# Patient Record
Sex: Male | Born: 1990 | Race: Black or African American | Hispanic: No | Marital: Single | Smoking: Current every day smoker
Health system: Southern US, Community
[De-identification: ages and names within clinical notes are randomized; demographics above are authoritative.]

## PROBLEM LIST (undated history)

## (undated) DIAGNOSIS — W3400XA Accidental discharge from unspecified firearms or gun, initial encounter: Secondary | ICD-10-CM

## (undated) DIAGNOSIS — Y249XXA Unspecified firearm discharge, undetermined intent, initial encounter: Secondary | ICD-10-CM

## (undated) HISTORY — PX: LEG SURGERY: SHX1003

---

## 2020-03-06 ENCOUNTER — Emergency Department (HOSPITAL_COMMUNITY): Payer: No Typology Code available for payment source

## 2020-03-06 ENCOUNTER — Other Ambulatory Visit: Payer: Self-pay

## 2020-03-06 ENCOUNTER — Telehealth: Payer: Self-pay

## 2020-03-06 ENCOUNTER — Ambulatory Visit (HOSPITAL_COMMUNITY)
Admission: EM | Admit: 2020-03-06 | Discharge: 2020-03-06 | Disposition: A | Discharge status: Home / Self Care | Payer: No Typology Code available for payment source | Attending: Emergency Medicine | Admitting: Emergency Medicine

## 2020-03-06 ENCOUNTER — Encounter (HOSPITAL_COMMUNITY): Payer: Self-pay | Admitting: *Deleted

## 2020-03-06 ENCOUNTER — Emergency Department (HOSPITAL_COMMUNITY): Payer: No Typology Code available for payment source | Admitting: Certified Registered"

## 2020-03-06 ENCOUNTER — Encounter (HOSPITAL_COMMUNITY)
Admission: EM | Disposition: A | Discharge status: Home / Self Care | Payer: Self-pay | Source: Home / Self Care | Attending: Emergency Medicine

## 2020-03-06 DIAGNOSIS — S76129A Laceration of unspecified quadriceps muscle, fascia and tendon, initial encounter: Secondary | ICD-10-CM | POA: Insufficient documentation

## 2020-03-06 DIAGNOSIS — F17210 Nicotine dependence, cigarettes, uncomplicated: Secondary | ICD-10-CM | POA: Insufficient documentation

## 2020-03-06 DIAGNOSIS — S81012A Laceration without foreign body, left knee, initial encounter: Secondary | ICD-10-CM | POA: Insufficient documentation

## 2020-03-06 DIAGNOSIS — S022XXA Fracture of nasal bones, initial encounter for closed fracture: Secondary | ICD-10-CM

## 2020-03-06 DIAGNOSIS — Y92410 Unspecified street and highway as the place of occurrence of the external cause: Secondary | ICD-10-CM | POA: Diagnosis not present

## 2020-03-06 DIAGNOSIS — S139XXA Sprain of joints and ligaments of unspecified parts of neck, initial encounter: Secondary | ICD-10-CM | POA: Insufficient documentation

## 2020-03-06 DIAGNOSIS — S301XXA Contusion of abdominal wall, initial encounter: Secondary | ICD-10-CM

## 2020-03-06 DIAGNOSIS — S0101XA Laceration without foreign body of scalp, initial encounter: Secondary | ICD-10-CM | POA: Diagnosis not present

## 2020-03-06 DIAGNOSIS — S0990XA Unspecified injury of head, initial encounter: Secondary | ICD-10-CM

## 2020-03-06 DIAGNOSIS — S76122A Laceration of left quadriceps muscle, fascia and tendon, initial encounter: Secondary | ICD-10-CM

## 2020-03-06 DIAGNOSIS — Z20822 Contact with and (suspected) exposure to covid-19: Secondary | ICD-10-CM | POA: Insufficient documentation

## 2020-03-06 HISTORY — PX: IRRIGATION AND DEBRIDEMENT KNEE: SHX5185

## 2020-03-06 HISTORY — PX: SCALP LACERATION REPAIR: SHX6089

## 2020-03-06 LAB — CBC WITH DIFFERENTIAL/PLATELET
Abs Immature Granulocytes: 0.03 10*3/uL (ref 0.00–0.07)
Basophils Absolute: 0.1 10*3/uL (ref 0.0–0.1)
Basophils Relative: 1 %
Eosinophils Absolute: 0.1 10*3/uL (ref 0.0–0.5)
Eosinophils Relative: 1 %
HCT: 48.9 % (ref 39.0–52.0)
Hemoglobin: 16.8 g/dL (ref 13.0–17.0)
Immature Granulocytes: 0 %
Lymphocytes Relative: 28 %
Lymphs Abs: 2.3 10*3/uL (ref 0.7–4.0)
MCH: 34.4 pg — ABNORMAL HIGH (ref 26.0–34.0)
MCHC: 34.4 g/dL (ref 30.0–36.0)
MCV: 100 fL (ref 80.0–100.0)
Monocytes Absolute: 0.8 10*3/uL (ref 0.1–1.0)
Monocytes Relative: 11 %
Neutro Abs: 4.7 10*3/uL (ref 1.7–7.7)
Neutrophils Relative %: 59 %
Platelets: 187 10*3/uL (ref 150–400)
RBC: 4.89 MIL/uL (ref 4.22–5.81)
RDW: 11.2 % — ABNORMAL LOW (ref 11.5–15.5)
WBC: 8 10*3/uL (ref 4.0–10.5)
nRBC: 0 % (ref 0.0–0.2)

## 2020-03-06 LAB — COMPREHENSIVE METABOLIC PANEL
ALT: 23 U/L (ref 0–44)
AST: 38 U/L (ref 15–41)
Albumin: 5.3 g/dL — ABNORMAL HIGH (ref 3.5–5.0)
Alkaline Phosphatase: 53 U/L (ref 38–126)
Anion gap: 10 (ref 5–15)
BUN: 5 mg/dL — ABNORMAL LOW (ref 6–20)
CO2: 26 mmol/L (ref 22–32)
Calcium: 9.9 mg/dL (ref 8.9–10.3)
Chloride: 102 mmol/L (ref 98–111)
Creatinine, Ser: 0.99 mg/dL (ref 0.61–1.24)
GFR calc Af Amer: >60 mL/min (ref >60)
GFR calc non Af Amer: >60 mL/min (ref >60)
Glucose, Bld: 92 mg/dL (ref 70–99)
Potassium: 3.8 mmol/L (ref 3.5–5.1)
Sodium: 138 mmol/L (ref 135–145)
Total Bilirubin: 5 mg/dL — ABNORMAL HIGH (ref 0.3–1.2)
Total Protein: 7.6 g/dL (ref 6.5–8.1)

## 2020-03-06 LAB — URINALYSIS, ROUTINE W REFLEX MICROSCOPIC
Bilirubin Urine: NEGATIVE
Glucose, UA: NEGATIVE mg/dL
Hgb urine dipstick: NEGATIVE
Ketones, ur: NEGATIVE mg/dL
Leukocytes,Ua: NEGATIVE
Nitrite: NEGATIVE
Protein, ur: NEGATIVE mg/dL
Specific Gravity, Urine: 1.021 (ref 1.005–1.030)
pH: 7 (ref 5.0–8.0)

## 2020-03-06 LAB — SARS CORONAVIRUS 2 BY RT PCR (HOSPITAL ORDER, PERFORMED IN ~~LOC~~ HOSPITAL LAB): SARS Coronavirus 2: NEGATIVE

## 2020-03-06 LAB — ETHANOL: Alcohol, Ethyl (B): <10 mg/dL (ref <10)

## 2020-03-06 SURGERY — IRRIGATION AND DEBRIDEMENT KNEE
Anesthesia: General | Site: Knee | Laterality: Left

## 2020-03-06 MED ORDER — FENTANYL CITRATE (PF) 100 MCG/2ML IJ SOLN: 25.0000 ug | INTRAMUSCULAR | Status: DC | PRN

## 2020-03-06 MED ORDER — ROCURONIUM BROMIDE 10 MG/ML (PF) SYRINGE
PREFILLED_SYRINGE | INTRAVENOUS | Status: AC
  Filled 2020-03-06: qty 10

## 2020-03-06 MED ORDER — BUPIVACAINE HCL (PF) 0.5 % IJ SOLN
INTRAMUSCULAR | Status: DC | PRN
  Administered 2020-03-06: 10 mL

## 2020-03-06 MED ORDER — PROPOFOL 10 MG/ML IV BOLUS
INTRAVENOUS | Status: AC
  Filled 2020-03-06: qty 20

## 2020-03-06 MED ORDER — ONDANSETRON HCL 4 MG/2ML IJ SOLN
INTRAMUSCULAR | Status: AC
  Filled 2020-03-06: qty 2

## 2020-03-06 MED ORDER — SUCCINYLCHOLINE CHLORIDE 20 MG/ML IJ SOLN
INTRAMUSCULAR | Status: DC | PRN
  Administered 2020-03-06: 120 mg via INTRAVENOUS

## 2020-03-06 MED ORDER — LACTATED RINGERS IV SOLN: INTRAVENOUS | Status: DC

## 2020-03-06 MED ORDER — FENTANYL CITRATE (PF) 250 MCG/5ML IJ SOLN
INTRAMUSCULAR | Status: AC
  Filled 2020-03-06: qty 5

## 2020-03-06 MED ORDER — IOHEXOL 300 MG/ML  SOLN
100.0000 mL | Freq: Once | INTRAMUSCULAR | Status: AC | PRN
  Administered 2020-03-06: 100 mL via INTRAVENOUS

## 2020-03-06 MED ORDER — SODIUM CHLORIDE 0.9 % IR SOLN
Status: DC | PRN
  Administered 2020-03-06: 6000 mL

## 2020-03-06 MED ORDER — ONDANSETRON HCL 4 MG/2ML IJ SOLN: 4.0000 mg | Freq: Once | INTRAMUSCULAR | Status: DC | PRN

## 2020-03-06 MED ORDER — SUCCINYLCHOLINE CHLORIDE 200 MG/10ML IV SOSY
PREFILLED_SYRINGE | INTRAVENOUS | Status: AC
  Filled 2020-03-06: qty 10

## 2020-03-06 MED ORDER — LIDOCAINE 2% (20 MG/ML) 5 ML SYRINGE
INTRAMUSCULAR | Status: AC
  Filled 2020-03-06: qty 5

## 2020-03-06 MED ORDER — SODIUM CHLORIDE 0.9 % IV BOLUS
1000.0000 mL | Freq: Once | INTRAVENOUS | Status: AC
  Administered 2020-03-06: 1000 mL via INTRAVENOUS

## 2020-03-06 MED ORDER — DEXAMETHASONE SODIUM PHOSPHATE 10 MG/ML IJ SOLN
INTRAMUSCULAR | Status: AC
  Filled 2020-03-06: qty 1

## 2020-03-06 MED ORDER — DEXMEDETOMIDINE HCL 200 MCG/2ML IV SOLN
INTRAVENOUS | Status: DC | PRN
  Administered 2020-03-06: 20 ug via INTRAVENOUS

## 2020-03-06 MED ORDER — LACTATED RINGERS IV SOLN: INTRAVENOUS | Status: DC | PRN

## 2020-03-06 MED ORDER — VANCOMYCIN HCL 1000 MG IV SOLR
INTRAVENOUS | Status: AC
  Filled 2020-03-06: qty 1000

## 2020-03-06 MED ORDER — CHLORHEXIDINE GLUCONATE 0.12 % MT SOLN: 15.0000 mL | Freq: Once | OROMUCOSAL | Status: DC

## 2020-03-06 MED ORDER — HYDROCODONE-ACETAMINOPHEN 5-325 MG PO TABS: 1.0000 | ORAL_TABLET | Freq: Four times a day (QID) | ORAL | 0 refills | Status: DC | PRN

## 2020-03-06 MED ORDER — ASPIRIN EC 325 MG PO TBEC
325.0000 mg | DELAYED_RELEASE_TABLET | Freq: Every day | ORAL | 0 refills | Status: AC
Start: 2020-03-06 — End: 2020-04-05

## 2020-03-06 MED ORDER — 0.9 % SODIUM CHLORIDE (POUR BTL) OPTIME
TOPICAL | Status: DC | PRN
  Administered 2020-03-06: 1000 mL

## 2020-03-06 MED ORDER — OXYCODONE HCL 5 MG PO TABS
5.0000 mg | ORAL_TABLET | Freq: Once | ORAL | Status: AC | PRN
  Administered 2020-03-06: 5 mg via ORAL

## 2020-03-06 MED ORDER — OXYCODONE HCL 5 MG/5ML PO SOLN: 5.0000 mg | Freq: Once | ORAL | Status: AC | PRN

## 2020-03-06 MED ORDER — CEFAZOLIN SODIUM-DEXTROSE 1-4 GM/50ML-% IV SOLN
1.0000 g | Freq: Once | INTRAVENOUS | Status: AC
  Administered 2020-03-06: 1 g via INTRAVENOUS
  Filled 2020-03-06: qty 50

## 2020-03-06 MED ORDER — BUPIVACAINE HCL (PF) 0.5 % IJ SOLN
INTRAMUSCULAR | Status: AC
  Filled 2020-03-06: qty 30

## 2020-03-06 MED ORDER — PROPOFOL 10 MG/ML IV BOLUS
INTRAVENOUS | Status: DC | PRN
  Administered 2020-03-06: 30 mg via INTRAVENOUS
  Administered 2020-03-06: 170 mg via INTRAVENOUS

## 2020-03-06 MED ORDER — MIDAZOLAM HCL 2 MG/2ML IJ SOLN
INTRAMUSCULAR | Status: AC
  Filled 2020-03-06: qty 2

## 2020-03-06 MED ORDER — VANCOMYCIN HCL 1000 MG IV SOLR
INTRAVENOUS | Status: DC | PRN
  Administered 2020-03-06: 1000 mg via TOPICAL

## 2020-03-06 MED ORDER — FENTANYL CITRATE (PF) 100 MCG/2ML IJ SOLN
INTRAMUSCULAR | Status: DC | PRN
  Administered 2020-03-06: 50 ug via INTRAVENOUS
  Administered 2020-03-06: 100 ug via INTRAVENOUS

## 2020-03-06 MED ORDER — ONDANSETRON HCL 4 MG/2ML IJ SOLN
INTRAMUSCULAR | Status: DC | PRN
  Administered 2020-03-06: 4 mg via INTRAVENOUS

## 2020-03-06 MED ORDER — OXYCODONE HCL 5 MG PO TABS
ORAL_TABLET | ORAL | Status: AC
  Filled 2020-03-06: qty 1

## 2020-03-06 MED ORDER — DEXAMETHASONE SODIUM PHOSPHATE 4 MG/ML IJ SOLN
INTRAMUSCULAR | Status: DC | PRN
  Administered 2020-03-06: 8 mg via INTRAVENOUS

## 2020-03-06 MED ORDER — LIDOCAINE HCL (CARDIAC) PF 100 MG/5ML IV SOSY
PREFILLED_SYRINGE | INTRAVENOUS | Status: DC | PRN
  Administered 2020-03-06: 40 mg via INTRAVENOUS

## 2020-03-06 MED ORDER — ORAL CARE MOUTH RINSE: 15.0000 mL | Freq: Once | OROMUCOSAL | Status: DC

## 2020-03-06 SURGICAL SUPPLY — 52 items
BNDG COHESIVE 4X5 TAN STRL (GAUZE/BANDAGES/DRESSINGS) ×3 IMPLANT
BNDG GAUZE ELAST 4 BULKY (GAUZE/BANDAGES/DRESSINGS) ×6 IMPLANT
BRUSH SCRUB EZ PLAIN DRY (MISCELLANEOUS) ×6 IMPLANT
CHLORAPREP W/TINT 26 (MISCELLANEOUS) ×3 IMPLANT
COVER MAYO STAND STRL (DRAPES) ×3 IMPLANT
COVER SURGICAL LIGHT HANDLE (MISCELLANEOUS) ×6 IMPLANT
COVER WAND RF STERILE (DRAPES) ×3 IMPLANT
DRAPE ORTHO SPLIT 77X108 STRL (DRAPES) ×1
DRAPE SURG 17X23 STRL (DRAPES) ×3 IMPLANT
DRAPE SURG ORHT 6 SPLT 77X108 (DRAPES) ×2 IMPLANT
DRAPE U-SHAPE 47X51 STRL (DRAPES) ×3 IMPLANT
DRESSING PREVENA PLUS CUSTOM (GAUZE/BANDAGES/DRESSINGS) ×2 IMPLANT
DRSG ADAPTIC 3X8 NADH LF (GAUZE/BANDAGES/DRESSINGS) ×3 IMPLANT
DRSG PREVENA PLUS CUSTOM (GAUZE/BANDAGES/DRESSINGS) ×3
ELECT REM PT RETURN 9FT ADLT (ELECTROSURGICAL)
ELECTRODE REM PT RTRN 9FT ADLT (ELECTROSURGICAL) IMPLANT
EVACUATOR 1/8 PVC DRAIN (DRAIN) IMPLANT
GAUZE SPONGE 4X4 12PLY STRL (GAUZE/BANDAGES/DRESSINGS) ×3 IMPLANT
GLOVE BIO SURGEON STRL SZ 6.5 (GLOVE) ×9 IMPLANT
GLOVE BIO SURGEON STRL SZ7.5 (GLOVE) ×12 IMPLANT
GLOVE BIOGEL PI IND STRL 6.5 (GLOVE) ×2 IMPLANT
GLOVE BIOGEL PI IND STRL 7.5 (GLOVE) ×2 IMPLANT
GLOVE BIOGEL PI INDICATOR 6.5 (GLOVE) ×1
GLOVE BIOGEL PI INDICATOR 7.5 (GLOVE) ×1
GOWN STRL REUS W/ TWL LRG LVL3 (GOWN DISPOSABLE) ×4 IMPLANT
GOWN STRL REUS W/TWL LRG LVL3 (GOWN DISPOSABLE) ×2
HANDPIECE INTERPULSE COAX TIP (DISPOSABLE) ×1
IMMOBILIZER KNEE 20 (SOFTGOODS) ×3
IMMOBILIZER KNEE 20 THIGH 36 (SOFTGOODS) ×2 IMPLANT
KIT BASIN OR (CUSTOM PROCEDURE TRAY) ×3 IMPLANT
KIT PREVENA INCISION MGT20CM45 (CANNISTER) ×3 IMPLANT
KIT TURNOVER KIT B (KITS) ×3 IMPLANT
MANIFOLD NEPTUNE II (INSTRUMENTS) ×3 IMPLANT
NS IRRIG 1000ML POUR BTL (IV SOLUTION) ×3 IMPLANT
PACK ORTHO EXTREMITY (CUSTOM PROCEDURE TRAY) ×3 IMPLANT
PAD ARMBOARD 7.5X6 YLW CONV (MISCELLANEOUS) ×6 IMPLANT
PADDING CAST COTTON 6X4 STRL (CAST SUPPLIES) ×3 IMPLANT
SET HNDPC FAN SPRY TIP SCT (DISPOSABLE) ×2 IMPLANT
SPONGE LAP 18X18 RF (DISPOSABLE) ×3 IMPLANT
STAPLER VISISTAT 35W (STAPLE) ×3 IMPLANT
SUT ETHILON 2 0 FS 18 (SUTURE) ×6 IMPLANT
SUT ETHILON 3 0 PS 1 (SUTURE) ×15 IMPLANT
SUT MON AB 2-0 CT1 36 (SUTURE) ×6 IMPLANT
SUT PDS AB 0 CT 36 (SUTURE) ×3 IMPLANT
SWAB CULTURE ESWAB REG 1ML (MISCELLANEOUS) IMPLANT
TIP HIGH FLOW IRRIGATION COAX (MISCELLANEOUS) ×3 IMPLANT
TOWEL GREEN STERILE (TOWEL DISPOSABLE) ×6 IMPLANT
TOWEL GREEN STERILE FF (TOWEL DISPOSABLE) ×3 IMPLANT
TUBE CONNECTING 12X1/4 (SUCTIONS) ×3 IMPLANT
UNDERPAD 30X36 HEAVY ABSORB (UNDERPADS AND DIAPERS) ×3 IMPLANT
WATER STERILE IRR 1000ML POUR (IV SOLUTION) ×3 IMPLANT
YANKAUER SUCT BULB TIP NO VENT (SUCTIONS) ×3 IMPLANT

## 2020-03-06 NOTE — Discharge Instructions (Addendum)
[facility]uma [facility] Discharge Instructions   General Discharge Instructions  WEIGHT BEARING STATUS: Weightbearing as tolerated left lower extremity in knee immobilizer  RANGE OF MOTION/ACTIVITY: Keep immobilizer on at all times.   Wound Care: Okay to remove surgical dressing on post op day #2 (Tuesday 03/08/20). Leave wound vac (purple sponge) in place until follow-up.   DVT/PE prophylaxis: Aspirin 325 mg daily x 30 days  Diet: as you were eating previously.  Can use over the counter stool softeners and bowel preparations, such as Miralax, to help with bowel movements.  Narcotics can be constipating.  Be sure to drink plenty of fluids  PAIN MEDICATION USE AND EXPECTATIONS  You have likely been given narcotic medications to help control your pain.  After a traumatic event that results in an fracture (broken bone) with or without surgery, it is ok to use narcotic pain medications to help control one's pain.  We understand that everyone responds to pain differently and each individual patient will be evaluated on a regular basis for the continued need for narcotic medications. Ideally, narcotic medication use should last no more than 6-8 weeks (coinciding with fracture healing).   As a patient it is your responsibility as well to monitor narcotic medication use and report the amount and frequency you use these medications when you come to your office visit.   We would also advise that if you are using narcotic medications, you should take a dose prior to therapy to maximize you participation.  IF YOU ARE ON NARCOTIC MEDICATIONS IT IS NOT PERMISSIBLE TO OPERATE A MOTOR VEHICLE (MOTORCYCLE/CAR/TRUCK/MOPED) OR HEAVY MACHINERY DO NOT MIX NARCOTICS WITH OTHER CNS (CENTRAL NERVOUS SYSTEM) DEPRESSANTS SUCH AS ALCOHOL   STOP SMOKING OR USING NICOTINE PRODUCTS!!!!  As discussed nicotine severely impairs your body's ability to heal surgical and traumatic wounds but also impairs bone healing.   Wounds and bone heal by forming microscopic blood vessels (angiogenesis) and nicotine is a vasoconstrictor (essentially, shrinks blood vessels).  Therefore, if vasoconstriction occurs to these microscopic blood vessels they essentially disappear and are unable to deliver necessary nutrients to the healing tissue.  This is one modifiable factor that you can do to dramatically increase your chances of healing your injury.    (This means no smoking, no nicotine gum, patches, etc)  DO NOT USE NONSTEROIDAL ANTI-INFLAMMATORY DRUGS (NSAID'S)  Using products such as Advil (ibuprofen), Aleve (naproxen), Motrin (ibuprofen) for additional pain control during fracture healing can delay and/or prevent the healing response.  If you would like to take over the counter (OTC) medication, Tylenol (acetaminophen) is ok.  However, some narcotic medications that are given for pain control contain acetaminophen as well. Therefore, you should not exceed more than 4000 mg of tylenol in a day if you do not have liver disease.  Also note that there are may OTC medicines, such as cold medicines and allergy medicines that my contain tylenol as well.  If you have any questions about medications and/or interactions please ask your doctor/PA or your pharmacist.      ICE AND ELEVATE INJURED/OPERATIVE EXTREMITY  Using ice and elevating the injured extremity above your heart can help with swelling and pain control.  Icing in a pulsatile fashion, such as 20 minutes on and 20 minutes off, can be followed.    Do not place ice directly on skin. Make sure there is a barrier between to skin and the ice pack.    Using frozen items such as frozen peas works well  as the conform nicely to the are that needs to be iced.  USE AN ACE WRAP OR TED HOSE FOR SWELLING CONTROL  In addition to icing and elevation, Ace wraps or TED hose are used to help limit and resolve swelling.  It is recommended to use Ace wraps or TED hose until you are informed to  stop.    When using Ace Wraps start the wrapping distally (farthest away from the body) and wrap proximally (closer to the body)   Example: If you had surgery on your leg or thing and you do not have a splint on, start the ace wrap at the toes and work your way up to the thigh        If you had surgery on your upper extremity and do not have a splint on, start the ace wrap at your fingers and work your way up to the upper arm  IF YOU ARE IN A SPLINT OR CAST DO NOT REMOVE IT FOR ANY REASON   If your splint gets wet for any reason please contact the office immediately. You may shower in your splint or cast as long as you keep it dry.  This can be done by wrapping in a cast cover or garbage back (or similar)  Do Not stick any thing down your splint or cast such as pencils, money, or hangers to try and scratch yourself with.  If you feel itchy take benadryl as prescribed on the bottle for itching  IF YOU ARE IN A CAM BOOT (BLACK BOOT)  You may remove boot periodically. Perform daily dressing changes as noted below.  Wash the liner of the boot regularly and wear a sock when wearing the boot. It is recommended that you sleep in the boot until told otherwise   CALL THE OFFICE WITH ANY QUESTIONS OR CONCERNS: [phone]   VISIT OUR WEBSITE FOR ADDITIONAL INFORMATION: [email or web address]      Discharge Wound Care Instructions  Do NOT apply any ointments, solutions or lotions to pin sites or surgical wounds.  These prevent needed drainage and even though solutions like hydrogen peroxide kill bacteria, they also damage cells lining the pin sites that help fight infection.  Applying lotions or ointments can keep the wounds moist and can cause them to breakdown and open up as well. This can increase the risk for infection. When in doubt call the office.  Surgical incisions should be dressed daily.  If any drainage is noted, use one layer of adaptic, then gauze, Kerlix, and an ace wrap.  Once the  incision is completely dry and without drainage, it may be left open to air out.  Showering may begin 36-48 hours later.  Cleaning gently with soap and water.  Traumatic wounds should be dressed daily as well.    One layer of adaptic, gauze, Kerlix, then ace wrap.  The adaptic can be discontinued once the draining has ceased    If you have a wet to dry dressing: wet the gauze with saline the squeeze as much saline out so the gauze is moist (not soaking wet), place moistened gauze over wound, then place a dry gauze over the moist one, followed by Kerlix wrap, then ace wrap.

## 2020-03-06 NOTE — Anesthesia Preprocedure Evaluation (Signed)
Anesthesia Evaluation  Patient identified by MRN, date of birth, ID band Patient awake    Reviewed: Allergy & Precautions, NPO status , Patient's Chart, lab work & pertinent test results  Airway Mallampati: II  TM Distance: >3 FB Neck ROM: Full    Dental  (+) Teeth Intact, Dental Advisory Given   Pulmonary Current Smoker,    breath sounds clear to auscultation       Cardiovascular  Rhythm:Regular Rate:Normal     Neuro/Psych    GI/Hepatic   Endo/Other    Renal/GU      Musculoskeletal   Abdominal   Peds  Hematology   Anesthesia Other Findings   Reproductive/Obstetrics                             Anesthesia Physical Anesthesia Plan  ASA: II and emergent  Anesthesia Plan: General   Post-op Pain Management:    Induction: Intravenous, Rapid sequence and Cricoid pressure planned  PONV Risk Score and Plan: Ondansetron and Dexamethasone  Airway Management Planned: Oral ETT  Additional Equipment:   Intra-op Plan:   Post-operative Plan: Extubation in OR  Informed Consent: I have reviewed the patients History and Physical, chart, labs and discussed the procedure including the risks, benefits and alternatives for the proposed anesthesia with the patient or authorized representative who has indicated his/her understanding and acceptance.     Dental advisory given  Plan Discussed with: CRNA and Anesthesiologist  Anesthesia Plan Comments:         Anesthesia Quick Evaluation  
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
## 2020-03-06 NOTE — ED Notes (Signed)
Called patient mother Kriste Basque) [phone] patient spoke with her

## 2020-03-06 NOTE — Telephone Encounter (Signed)
Patients asked CM to call mother and tell her that he is here nad that he has to go to the OR. Called no answer left message for her to call my phone in reference to her son. Also left text message.
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
## 2020-03-06 NOTE — Anesthesia Procedure Notes (Signed)
Procedure Name: Intubation Date/Time: 03/06/2020 1:02 PM Performed by: Julian Reil, CRNA Pre-anesthesia Checklist: Patient identified, Emergency Drugs available, Suction available and Patient being monitored Patient Re-evaluated:Patient Re-evaluated prior to induction Oxygen Delivery Method: Circle system utilized Preoxygenation: Pre-oxygenation with 100% oxygen Induction Type: IV induction, Cricoid Pressure applied and Rapid sequence Laryngoscope Size: Miller and 3 Grade View: Grade I Tube type: Oral Tube size: 8.0 mm Number of attempts: 1 Airway Equipment and Method: Stylet Placement Confirmation: ETT inserted through vocal cords under direct vision,  positive ETCO2 and breath sounds checked- equal and bilateral Secured at: 23 cm Tube secured with: Tape Dental Injury: Teeth and Oropharynx as per pre-operative assessment  Comments: 4x4s bite block used.

## 2020-03-06 NOTE — Transfer of Care (Signed)
Immediate Anesthesia Transfer of Care Note  Patient: Nathan Ball  Procedure(s) Performed: IRRIGATION AND DEBRIDEMENT KNEE (Left Knee) Scalp Laceration  repair left upper (Left )  Patient Location: PACU  Anesthesia Type:General  Level of Consciousness: drowsy  Airway & Oxygen Therapy: Patient Spontanous Breathing and Patient connected to nasal cannula oxygen  Post-op Assessment: Report given to RN and Post -op Vital signs reviewed and stable  Post vital signs: Reviewed and stable  Last Vitals:  Vitals Value Taken Time  BP 123/55 03/06/20 1413  Temp    Pulse 63 03/06/20 1418  Resp 14 03/06/20 1418  SpO2 100 % 03/06/20 1418  Vitals shown include unvalidated device data.  Last Pain:  Vitals:   03/06/20 1218  TempSrc: Oral  PainSc: 9          Complications: No apparent anesthesia complications
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
## 2020-03-06 NOTE — Anesthesia Postprocedure Evaluation (Signed)
Anesthesia Post Note  Patient: Nathan Ball  Procedure(s) Performed: IRRIGATION AND DEBRIDEMENT KNEE (Left Knee) Scalp Laceration  repair left upper (Left )     Patient location during evaluation: PACU Anesthesia Type: General Level of consciousness: awake, oriented and lethargic Pain management: pain level controlled Vital Signs Assessment: post-procedure vital signs reviewed and stable Respiratory status: spontaneous breathing, nonlabored ventilation, respiratory function stable and patient connected to nasal cannula oxygen Cardiovascular status: blood pressure returned to baseline and stable Postop Assessment: no apparent nausea or vomiting Anesthetic complications: no    Last Vitals:  Vitals:   03/06/20 1450 03/06/20 1505  BP: (!) 147/77 (!) 138/91  Pulse: (!) 45 (!) 57  Resp: 14 16  Temp:    SpO2: 100% 100%    Last Pain:  Vitals:   03/06/20 1450  TempSrc:   PainSc: Asleep                 Florabel Faulks COKER
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
## 2021-08-06 ENCOUNTER — Inpatient Hospital Stay (HOSPITAL_COMMUNITY)
Admission: EM | Admit: 2021-08-06 | Discharge: 2021-08-09 | DRG: 492 | Disposition: A | Discharge status: Home / Self Care | Payer: Self-pay | Attending: Student | Admitting: Student

## 2021-08-06 ENCOUNTER — Other Ambulatory Visit: Payer: Self-pay

## 2021-08-06 ENCOUNTER — Emergency Department (HOSPITAL_COMMUNITY): Payer: Self-pay

## 2021-08-06 DIAGNOSIS — Z419 Encounter for procedure for purposes other than remedying health state, unspecified: Secondary | ICD-10-CM

## 2021-08-06 DIAGNOSIS — T148XXA Other injury of unspecified body region, initial encounter: Secondary | ICD-10-CM

## 2021-08-06 DIAGNOSIS — W3400XA Accidental discharge from unspecified firearms or gun, initial encounter: Principal | ICD-10-CM

## 2021-08-06 DIAGNOSIS — S91109A Unspecified open wound of unspecified toe(s) without damage to nail, initial encounter: Secondary | ICD-10-CM

## 2021-08-06 DIAGNOSIS — T1490XA Injury, unspecified, initial encounter: Secondary | ICD-10-CM

## 2021-08-06 DIAGNOSIS — S62521B Displaced fracture of distal phalanx of right thumb, initial encounter for open fracture: Secondary | ICD-10-CM

## 2021-08-06 DIAGNOSIS — S72351A Displaced comminuted fracture of shaft of right femur, initial encounter for closed fracture: Secondary | ICD-10-CM | POA: Diagnosis present

## 2021-08-06 DIAGNOSIS — S82251B Displaced comminuted fracture of shaft of right tibia, initial encounter for open fracture type I or II: Secondary | ICD-10-CM

## 2021-08-06 DIAGNOSIS — D62 Acute posthemorrhagic anemia: Secondary | ICD-10-CM | POA: Diagnosis not present

## 2021-08-06 DIAGNOSIS — S62511A Displaced fracture of proximal phalanx of right thumb, initial encounter for closed fracture: Secondary | ICD-10-CM

## 2021-08-06 DIAGNOSIS — S62511B Displaced fracture of proximal phalanx of right thumb, initial encounter for open fracture: Secondary | ICD-10-CM | POA: Diagnosis present

## 2021-08-06 DIAGNOSIS — Z20822 Contact with and (suspected) exposure to covid-19: Secondary | ICD-10-CM | POA: Diagnosis present

## 2021-08-06 DIAGNOSIS — E872 Acidosis, unspecified: Secondary | ICD-10-CM | POA: Diagnosis present

## 2021-08-06 DIAGNOSIS — F17210 Nicotine dependence, cigarettes, uncomplicated: Secondary | ICD-10-CM | POA: Diagnosis present

## 2021-08-06 DIAGNOSIS — S62201A Unspecified fracture of first metacarpal bone, right hand, initial encounter for closed fracture: Secondary | ICD-10-CM | POA: Diagnosis present

## 2021-08-06 LAB — I-STAT CHEM 8, ED
BUN: 5 mg/dL — ABNORMAL LOW (ref 6–20)
Calcium, Ion: 1.01 mmol/L — ABNORMAL LOW (ref 1.15–1.40)
Chloride: 109 mmol/L (ref 98–111)
Creatinine, Ser: 1 mg/dL (ref 0.61–1.24)
Glucose, Bld: 154 mg/dL — ABNORMAL HIGH (ref 70–99)
HCT: 51 % (ref 39.0–52.0)
Hemoglobin: 17.3 g/dL — ABNORMAL HIGH (ref 13.0–17.0)
Potassium: 3 mmol/L — ABNORMAL LOW (ref 3.5–5.1)
Sodium: 139 mmol/L (ref 135–145)
TCO2: 11 mmol/L — ABNORMAL LOW (ref 22–32)

## 2021-08-06 LAB — URINALYSIS, ROUTINE W REFLEX MICROSCOPIC
Bilirubin Urine: NEGATIVE
Glucose, UA: NEGATIVE mg/dL
Hgb urine dipstick: NEGATIVE
Ketones, ur: 5 mg/dL — AB
Leukocytes,Ua: NEGATIVE
Nitrite: NEGATIVE
Protein, ur: 30 mg/dL — AB
Specific Gravity, Urine: 1.018 (ref 1.005–1.030)
pH: 5 (ref 5.0–8.0)

## 2021-08-06 LAB — COMPREHENSIVE METABOLIC PANEL
ALT: 24 U/L (ref 0–44)
AST: 35 U/L (ref 15–41)
Albumin: 4.3 g/dL (ref 3.5–5.0)
Alkaline Phosphatase: 42 U/L (ref 38–126)
Anion gap: 25 — ABNORMAL HIGH (ref 5–15)
BUN: 7 mg/dL (ref 6–20)
CO2: 9 mmol/L — ABNORMAL LOW (ref 22–32)
Calcium: 10.2 mg/dL (ref 8.9–10.3)
Chloride: 104 mmol/L (ref 98–111)
Creatinine, Ser: 1.38 mg/dL — ABNORMAL HIGH (ref 0.61–1.24)
GFR, Estimated: >60 mL/min (ref >60)
Glucose, Bld: 167 mg/dL — ABNORMAL HIGH (ref 70–99)
Potassium: 3.4 mmol/L — ABNORMAL LOW (ref 3.5–5.1)
Sodium: 138 mmol/L (ref 135–145)
Total Bilirubin: 3.6 mg/dL — ABNORMAL HIGH (ref 0.3–1.2)
Total Protein: 6.6 g/dL (ref 6.5–8.1)

## 2021-08-06 LAB — PROTIME-INR
INR: 1.1 (ref 0.8–1.2)
Prothrombin Time: 14.6 seconds (ref 11.4–15.2)

## 2021-08-06 LAB — CBC
HCT: 55 % — ABNORMAL HIGH (ref 39.0–52.0)
Hemoglobin: 17.9 g/dL — ABNORMAL HIGH (ref 13.0–17.0)
MCH: 34.4 pg — ABNORMAL HIGH (ref 26.0–34.0)
MCHC: 32.5 g/dL (ref 30.0–36.0)
MCV: 105.8 fL — ABNORMAL HIGH (ref 80.0–100.0)
Platelets: 376 10*3/uL (ref 150–400)
RBC: 5.2 MIL/uL (ref 4.22–5.81)
RDW: 11 % — ABNORMAL LOW (ref 11.5–15.5)
WBC: 16 10*3/uL — ABNORMAL HIGH (ref 4.0–10.5)
nRBC: 0 % (ref 0.0–0.2)

## 2021-08-06 LAB — RESP PANEL BY RT-PCR (FLU A&B, COVID) ARPGX2
Influenza A by PCR: NEGATIVE
Influenza B by PCR: NEGATIVE
SARS Coronavirus 2 by RT PCR: NEGATIVE

## 2021-08-06 LAB — LACTIC ACID, PLASMA
Lactic Acid, Venous: >9 mmol/L (ref 0.5–1.9)
Lactic Acid, Venous: 2.1 mmol/L (ref 0.5–1.9)

## 2021-08-06 LAB — ETHANOL: Alcohol, Ethyl (B): 18 mg/dL — ABNORMAL HIGH (ref <10)

## 2021-08-06 MED ORDER — ACETAMINOPHEN 500 MG PO TABS
1000.0000 mg | ORAL_TABLET | Freq: Four times a day (QID) | ORAL | Status: DC
  Administered 2021-08-06 – 2021-08-09 (×11): 1000 mg via ORAL
  Filled 2021-08-06 (×11): qty 2

## 2021-08-06 MED ORDER — TRAMADOL HCL 50 MG PO TABS
50.0000 mg | ORAL_TABLET | Freq: Four times a day (QID) | ORAL | Status: DC
Start: 2021-08-06 — End: 2021-08-08
  Administered 2021-08-06 – 2021-08-08 (×6): 50 mg via ORAL
  Filled 2021-08-06 (×6): qty 1

## 2021-08-06 MED ORDER — FENTANYL CITRATE PF 50 MCG/ML IJ SOSY
PREFILLED_SYRINGE | INTRAMUSCULAR | Status: AC
  Filled 2021-08-06: qty 1

## 2021-08-06 MED ORDER — FENTANYL CITRATE PF 50 MCG/ML IJ SOSY
50.0000 ug | PREFILLED_SYRINGE | INTRAMUSCULAR | Status: DC | PRN
Start: 2021-08-06 — End: 2021-08-07
  Administered 2021-08-06 – 2021-08-07 (×3): 50 ug via INTRAVENOUS
  Filled 2021-08-06 (×3): qty 1

## 2021-08-06 MED ORDER — FENTANYL CITRATE PF 50 MCG/ML IJ SOSY
100.0000 ug | PREFILLED_SYRINGE | Freq: Once | INTRAMUSCULAR | Status: DC
Start: 2021-08-06 — End: 2021-08-07

## 2021-08-06 MED ORDER — HYDROMORPHONE HCL 1 MG/ML IJ SOLN
1.0000 mg | Freq: Once | INTRAMUSCULAR | Status: AC
  Administered 2021-08-06: 1 mg via INTRAVENOUS
  Filled 2021-08-06: qty 1

## 2021-08-06 MED ORDER — OXYCODONE HCL 5 MG PO TABS
5.0000 mg | ORAL_TABLET | Freq: Four times a day (QID) | ORAL | Status: DC | PRN
  Administered 2021-08-06: 5 mg via ORAL
  Filled 2021-08-06: qty 1

## 2021-08-06 MED ORDER — FENTANYL CITRATE (PF) 100 MCG/2ML IJ SOLN
INTRAMUSCULAR | Status: AC
  Filled 2021-08-06: qty 2

## 2021-08-06 MED ORDER — MORPHINE SULFATE (PF) 4 MG/ML IV SOLN
INTRAVENOUS | Status: AC
  Administered 2021-08-06: 4 mg via INTRAVENOUS
  Filled 2021-08-06: qty 1

## 2021-08-06 MED ORDER — SODIUM CHLORIDE 0.9 % IV SOLN
2.0000 g | INTRAVENOUS | Status: DC
  Administered 2021-08-06: 2 g via INTRAVENOUS
  Filled 2021-08-06: qty 20

## 2021-08-06 MED ORDER — LACTATED RINGERS IV BOLUS
2000.0000 mL | Freq: Once | INTRAVENOUS | Status: AC
  Administered 2021-08-06: 2000 mL via INTRAVENOUS

## 2021-08-06 MED ORDER — MORPHINE SULFATE (PF) 4 MG/ML IV SOLN: 4.0000 mg | Freq: Once | INTRAVENOUS | Status: AC

## 2021-08-06 MED ORDER — FENTANYL CITRATE PF 50 MCG/ML IJ SOSY
50.0000 ug | PREFILLED_SYRINGE | Freq: Once | INTRAMUSCULAR | Status: AC
  Administered 2021-08-06: 50 ug via INTRAVENOUS
  Filled 2021-08-06: qty 1

## 2021-08-06 MED ORDER — FENTANYL CITRATE PF 50 MCG/ML IJ SOSY
50.0000 ug | PREFILLED_SYRINGE | Freq: Once | INTRAMUSCULAR | Status: AC
  Administered 2021-08-06: 50 ug via INTRAVENOUS
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
## 2021-08-06 NOTE — ED Notes (Addendum)
Critical Lactic > 9 called by lab. Primary RN and Bernette Mayers MD notified

## 2021-08-06 NOTE — H&P (Signed)
I have examined the patient at 1645 today. Compartments are compressible no pain with active or passive motion at the toes nervously intact with palpable pulses he does have some decreased sensation in his plantar foot. Right now his leg is not consistent with compartment syndrome
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
## 2021-08-06 NOTE — ED Notes (Signed)
Patient received ANCEF via [facility].
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
## 2021-08-06 NOTE — ED Notes (Signed)
Pt provided clean linens, support under injured leg rearranged for increased comfort. Pt given PRN pain medication, lights dimmed, mother at bedside. No additional requests at this time.
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
## 2021-08-06 NOTE — Progress Notes (Signed)
Orthopedic Tech Progress Note Patient Details:  Aws Shere [birth year] [id number]  Ortho Devices Type of Ortho Device: Long leg splint Ortho Device/Splint Location: rle Ortho Device/Splint Interventions: Ordered, Application, Adjustment   Post Interventions Patient Tolerated: Well Instructions Provided: Care of device, Poper ambulation with device  Gal Smolinski L Kimiye Strathman 08/06/2021, 7:23 PM

## 2021-08-06 NOTE — ED Notes (Signed)
Tourniquet removed
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
## 2021-08-07 ENCOUNTER — Encounter (HOSPITAL_COMMUNITY): Payer: Self-pay | Admitting: Orthopedic Surgery

## 2021-08-07 ENCOUNTER — Inpatient Hospital Stay (HOSPITAL_COMMUNITY): Payer: Self-pay | Admitting: Anesthesiology

## 2021-08-07 ENCOUNTER — Inpatient Hospital Stay (HOSPITAL_COMMUNITY): Payer: Self-pay

## 2021-08-07 ENCOUNTER — Encounter (HOSPITAL_COMMUNITY)
Admission: EM | Disposition: A | Discharge status: Home / Self Care | Payer: Self-pay | Source: Home / Self Care | Attending: Student

## 2021-08-07 HISTORY — PX: I & D EXTREMITY: SHX5045

## 2021-08-07 HISTORY — PX: TIBIA IM NAIL INSERTION: SHX2516

## 2021-08-07 LAB — CBC
HCT: 39.6 % (ref 39.0–52.0)
Hemoglobin: 13.8 g/dL (ref 13.0–17.0)
MCH: 34.4 pg — ABNORMAL HIGH (ref 26.0–34.0)
MCHC: 34.8 g/dL (ref 30.0–36.0)
MCV: 98.8 fL (ref 80.0–100.0)
Platelets: 255 10*3/uL (ref 150–400)
RBC: 4.01 MIL/uL — ABNORMAL LOW (ref 4.22–5.81)
RDW: 11.2 % — ABNORMAL LOW (ref 11.5–15.5)
WBC: 19.6 10*3/uL — ABNORMAL HIGH (ref 4.0–10.5)
nRBC: 0 % (ref 0.0–0.2)

## 2021-08-07 LAB — VITAMIN D 25 HYDROXY (VIT D DEFICIENCY, FRACTURES): Vit D, 25-Hydroxy: 6.95 ng/mL — ABNORMAL LOW (ref 30–100)

## 2021-08-07 LAB — CREATININE, SERUM
Creatinine, Ser: 0.99 mg/dL (ref 0.61–1.24)
GFR, Estimated: >60 mL/min (ref >60)

## 2021-08-07 LAB — SAMPLE TO BLOOD BANK

## 2021-08-07 SURGERY — INSERTION, INTRAMEDULLARY ROD, TIBIA
Anesthesia: General | Site: Thumb | Laterality: Right

## 2021-08-07 MED ORDER — ACETAMINOPHEN 500 MG PO TABS: 1000.0000 mg | ORAL_TABLET | Freq: Once | ORAL | Status: DC

## 2021-08-07 MED ORDER — FENTANYL CITRATE (PF) 250 MCG/5ML IJ SOLN
INTRAMUSCULAR | Status: AC
  Filled 2021-08-07: qty 5

## 2021-08-07 MED ORDER — PHENYLEPHRINE 40 MCG/ML (10ML) SYRINGE FOR IV PUSH (FOR BLOOD PRESSURE SUPPORT)
PREFILLED_SYRINGE | INTRAVENOUS | Status: AC
  Filled 2021-08-07: qty 10

## 2021-08-07 MED ORDER — MIDAZOLAM HCL 2 MG/2ML IJ SOLN
INTRAMUSCULAR | Status: DC | PRN
  Administered 2021-08-07: 2 mg via INTRAVENOUS

## 2021-08-07 MED ORDER — PROPOFOL 10 MG/ML IV BOLUS
INTRAVENOUS | Status: AC
  Filled 2021-08-07: qty 20

## 2021-08-07 MED ORDER — VANCOMYCIN HCL 1000 MG IV SOLR
INTRAVENOUS | Status: AC
  Filled 2021-08-07: qty 20

## 2021-08-07 MED ORDER — ROCURONIUM BROMIDE 100 MG/10ML IV SOLN
INTRAVENOUS | Status: DC | PRN
  Administered 2021-08-07: 15 mg via INTRAVENOUS
  Administered 2021-08-07: 60 mg via INTRAVENOUS

## 2021-08-07 MED ORDER — DEXAMETHASONE SODIUM PHOSPHATE 10 MG/ML IJ SOLN
INTRAMUSCULAR | Status: DC | PRN
  Administered 2021-08-07: 10 mg via INTRAVENOUS

## 2021-08-07 MED ORDER — HYDROMORPHONE HCL 1 MG/ML IJ SOLN
INTRAMUSCULAR | Status: AC
  Filled 2021-08-07: qty 1

## 2021-08-07 MED ORDER — CHLORHEXIDINE GLUCONATE 0.12 % MT SOLN: 15.0000 mL | Freq: Once | OROMUCOSAL | Status: AC

## 2021-08-07 MED ORDER — PHENYLEPHRINE 40 MCG/ML (10ML) SYRINGE FOR IV PUSH (FOR BLOOD PRESSURE SUPPORT)
PREFILLED_SYRINGE | INTRAVENOUS | Status: DC | PRN
Start: 2021-08-07 — End: 2021-08-07
  Administered 2021-08-07: 80 ug via INTRAVENOUS

## 2021-08-07 MED ORDER — BACITRACIN 500 UNIT/GM EX OINT
TOPICAL_OINTMENT | CUTANEOUS | Status: DC | PRN
  Administered 2021-08-07: 1 via TOPICAL

## 2021-08-07 MED ORDER — HYDROMORPHONE HCL 1 MG/ML IJ SOLN
0.2500 mg | INTRAMUSCULAR | Status: DC | PRN
  Administered 2021-08-07 (×2): 0.5 mg via INTRAVENOUS

## 2021-08-07 MED ORDER — DEXMEDETOMIDINE (PRECEDEX) IN NS 20 MCG/5ML (4 MCG/ML) IV SYRINGE
PREFILLED_SYRINGE | INTRAVENOUS | Status: AC
  Filled 2021-08-07: qty 5

## 2021-08-07 MED ORDER — OXYCODONE HCL 5 MG PO TABS
10.0000 mg | ORAL_TABLET | ORAL | Status: DC | PRN
  Administered 2021-08-07 – 2021-08-09 (×7): 15 mg via ORAL
  Filled 2021-08-07 (×7): qty 3

## 2021-08-07 MED ORDER — ONDANSETRON HCL 4 MG PO TABS: 4.0000 mg | ORAL_TABLET | Freq: Four times a day (QID) | ORAL | Status: DC | PRN

## 2021-08-07 MED ORDER — CEFTRIAXONE SODIUM 2 G IJ SOLR
2.0000 g | INTRAMUSCULAR | Status: DC
  Administered 2021-08-07 – 2021-08-08 (×2): 2 g via INTRAVENOUS
  Filled 2021-08-07 (×2): qty 20

## 2021-08-07 MED ORDER — CHLORHEXIDINE GLUCONATE 0.12 % MT SOLN
OROMUCOSAL | Status: AC
  Administered 2021-08-07: 15 mL
  Filled 2021-08-07: qty 15

## 2021-08-07 MED ORDER — VANCOMYCIN HCL 1000 MG IV SOLR
INTRAVENOUS | Status: DC | PRN
  Administered 2021-08-07: 1000 mg

## 2021-08-07 MED ORDER — POTASSIUM CHLORIDE IN NACL 20-0.9 MEQ/L-% IV SOLN
INTRAVENOUS | Status: DC
  Filled 2021-08-07: qty 1000

## 2021-08-07 MED ORDER — METOCLOPRAMIDE HCL 5 MG/ML IJ SOLN: 5.0000 mg | Freq: Three times a day (TID) | INTRAMUSCULAR | Status: DC | PRN

## 2021-08-07 MED ORDER — ONDANSETRON HCL 4 MG/2ML IJ SOLN
INTRAMUSCULAR | Status: AC
  Filled 2021-08-07: qty 2

## 2021-08-07 MED ORDER — 0.9 % SODIUM CHLORIDE (POUR BTL) OPTIME
TOPICAL | Status: DC | PRN
  Administered 2021-08-07 (×2): 1000 mL

## 2021-08-07 MED ORDER — LACTATED RINGERS IV SOLN: INTRAVENOUS | Status: DC

## 2021-08-07 MED ORDER — ORAL CARE MOUTH RINSE: 15.0000 mL | Freq: Once | OROMUCOSAL | Status: AC

## 2021-08-07 MED ORDER — PROMETHAZINE HCL 25 MG/ML IJ SOLN: 6.2500 mg | INTRAMUSCULAR | Status: DC | PRN

## 2021-08-07 MED ORDER — ONDANSETRON HCL 4 MG/2ML IJ SOLN
INTRAMUSCULAR | Status: DC | PRN
  Administered 2021-08-07: 4 mg via INTRAVENOUS

## 2021-08-07 MED ORDER — METHOCARBAMOL 500 MG PO TABS
500.0000 mg | ORAL_TABLET | Freq: Four times a day (QID) | ORAL | Status: DC | PRN
  Administered 2021-08-09: 500 mg via ORAL
  Filled 2021-08-07: qty 1

## 2021-08-07 MED ORDER — POLYETHYLENE GLYCOL 3350 17 G PO PACK: 17.0000 g | PACK | Freq: Every day | ORAL | Status: DC | PRN

## 2021-08-07 MED ORDER — DOCUSATE SODIUM 100 MG PO CAPS
100.0000 mg | ORAL_CAPSULE | Freq: Two times a day (BID) | ORAL | Status: DC
  Administered 2021-08-07 – 2021-08-09 (×3): 100 mg via ORAL
  Filled 2021-08-07 (×4): qty 1

## 2021-08-07 MED ORDER — FENTANYL CITRATE (PF) 100 MCG/2ML IJ SOLN
INTRAMUSCULAR | Status: DC | PRN
  Administered 2021-08-07 (×4): 50 ug via INTRAVENOUS
  Administered 2021-08-07: 100 ug via INTRAVENOUS
  Administered 2021-08-07 (×4): 50 ug via INTRAVENOUS

## 2021-08-07 MED ORDER — ENOXAPARIN SODIUM 40 MG/0.4ML IJ SOSY
40.0000 mg | PREFILLED_SYRINGE | INTRAMUSCULAR | Status: DC
  Administered 2021-08-08: 40 mg via SUBCUTANEOUS
  Filled 2021-08-07: qty 0.4

## 2021-08-07 MED ORDER — PROPOFOL 10 MG/ML IV BOLUS
INTRAVENOUS | Status: DC | PRN
  Administered 2021-08-07: 200 mg via INTRAVENOUS
  Administered 2021-08-07: 40 mg via INTRAVENOUS
  Administered 2021-08-07: 30 mg via INTRAVENOUS

## 2021-08-07 MED ORDER — BACITRACIN ZINC 500 UNIT/GM EX OINT
TOPICAL_OINTMENT | CUTANEOUS | Status: AC
  Filled 2021-08-07: qty 28.35

## 2021-08-07 MED ORDER — DEXAMETHASONE SODIUM PHOSPHATE 10 MG/ML IJ SOLN
INTRAMUSCULAR | Status: AC
  Filled 2021-08-07: qty 1

## 2021-08-07 MED ORDER — HYDROMORPHONE HCL 1 MG/ML IJ SOLN
0.5000 mg | INTRAMUSCULAR | Status: DC | PRN
  Administered 2021-08-08 – 2021-08-09 (×5): 1 mg via INTRAVENOUS
  Filled 2021-08-07 (×5): qty 1

## 2021-08-07 MED ORDER — ROCURONIUM BROMIDE 10 MG/ML (PF) SYRINGE
PREFILLED_SYRINGE | INTRAVENOUS | Status: AC
  Filled 2021-08-07: qty 10

## 2021-08-07 MED ORDER — ONDANSETRON HCL 4 MG/2ML IJ SOLN: 4.0000 mg | Freq: Four times a day (QID) | INTRAMUSCULAR | Status: DC | PRN

## 2021-08-07 MED ORDER — DEXMEDETOMIDINE (PRECEDEX) IN NS 20 MCG/5ML (4 MCG/ML) IV SYRINGE
PREFILLED_SYRINGE | INTRAVENOUS | Status: DC | PRN
  Administered 2021-08-07: 12 ug via INTRAVENOUS
  Administered 2021-08-07: 8 ug via INTRAVENOUS

## 2021-08-07 MED ORDER — METOCLOPRAMIDE HCL 5 MG PO TABS
5.0000 mg | ORAL_TABLET | Freq: Three times a day (TID) | ORAL | Status: DC | PRN
Start: 2021-08-07 — End: 2021-08-09

## 2021-08-07 MED ORDER — SUGAMMADEX SODIUM 200 MG/2ML IV SOLN
INTRAVENOUS | Status: DC | PRN
  Administered 2021-08-07: 200 mg via INTRAVENOUS

## 2021-08-07 MED ORDER — LIDOCAINE 2% (20 MG/ML) 5 ML SYRINGE
INTRAMUSCULAR | Status: DC | PRN
  Administered 2021-08-07: 100 mg via INTRAVENOUS

## 2021-08-07 MED ORDER — LIDOCAINE 2% (20 MG/ML) 5 ML SYRINGE
INTRAMUSCULAR | Status: AC
  Filled 2021-08-07: qty 5

## 2021-08-07 MED ORDER — METHOCARBAMOL 1000 MG/10ML IJ SOLN
500.0000 mg | Freq: Four times a day (QID) | INTRAVENOUS | Status: DC | PRN
  Filled 2021-08-07: qty 5

## 2021-08-07 MED ORDER — CEFAZOLIN SODIUM-DEXTROSE 2-4 GM/100ML-% IV SOLN
2.0000 g | INTRAVENOUS | Status: DC
  Filled 2021-08-07: qty 100

## 2021-08-07 MED ORDER — MIDAZOLAM HCL 2 MG/2ML IJ SOLN
INTRAMUSCULAR | Status: AC
  Filled 2021-08-07: qty 2

## 2021-08-07 MED ORDER — OXYCODONE HCL 5 MG PO TABS: 5.0000 mg | ORAL_TABLET | ORAL | Status: DC | PRN

## 2021-08-07 SURGICAL SUPPLY — 68 items
BAG COUNTER SPONGE SURGICOUNT (BAG) ×3 IMPLANT
BIT DRILL SHORT 4.2 (BIT) ×4 IMPLANT
BLADE SURG 10 STRL SS (BLADE) ×6 IMPLANT
BNDG COHESIVE 4X5 TAN STRL (GAUZE/BANDAGES/DRESSINGS) ×3 IMPLANT
BNDG ELASTIC 3X5.8 VLCR STR LF (GAUZE/BANDAGES/DRESSINGS) ×3 IMPLANT
BNDG ELASTIC 4X5.8 VLCR STR LF (GAUZE/BANDAGES/DRESSINGS) ×3 IMPLANT
BNDG ELASTIC 6X10 VLCR STRL LF (GAUZE/BANDAGES/DRESSINGS) ×3 IMPLANT
BNDG ELASTIC 6X5.8 VLCR STR LF (GAUZE/BANDAGES/DRESSINGS) ×3 IMPLANT
BNDG GAUZE ELAST 4 BULKY (GAUZE/BANDAGES/DRESSINGS) ×3 IMPLANT
BRUSH SCRUB EZ PLAIN DRY (MISCELLANEOUS) ×6 IMPLANT
CHLORAPREP W/TINT 26 (MISCELLANEOUS) ×3 IMPLANT
CORD BIPOLAR FORCEPS 12FT (ELECTRODE) ×3 IMPLANT
COVER SURGICAL LIGHT HANDLE (MISCELLANEOUS) ×6 IMPLANT
DRAPE C-ARM 42X72 X-RAY (DRAPES) ×3 IMPLANT
DRAPE C-ARM MINI 42X72 WSTRAPS (DRAPES) ×3 IMPLANT
DRAPE C-ARMOR (DRAPES) ×3 IMPLANT
DRAPE HALF SHEET 40X57 (DRAPES) ×6 IMPLANT
DRAPE IMP U-DRAPE 54X76 (DRAPES) ×6 IMPLANT
DRAPE INCISE IOBAN 66X45 STRL (DRAPES) IMPLANT
DRAPE ORTHO SPLIT 77X108 STRL (DRAPES) ×2
DRAPE ORTHO SPLIT ENLRG (DRAPES) ×3 IMPLANT
DRAPE SURG ORHT 6 SPLT 77X108 (DRAPES) ×4 IMPLANT
DRAPE U-SHAPE 47X51 STRL (DRAPES) ×3 IMPLANT
DRESSING MEPILEX FLEX 4X4 (GAUZE/BANDAGES/DRESSINGS) ×2 IMPLANT
DRILL BIT SHORT 4.2 (BIT) ×2
DRSG ADAPTIC 3X8 NADH LF (GAUZE/BANDAGES/DRESSINGS) ×3 IMPLANT
DRSG EMULSION OIL 3X3 NADH (GAUZE/BANDAGES/DRESSINGS) ×3 IMPLANT
DRSG MEPILEX FLEX 4X4 (GAUZE/BANDAGES/DRESSINGS) ×3
DRSG MEPITEL 4X7.2 (GAUZE/BANDAGES/DRESSINGS) ×3 IMPLANT
ELECT REM PT RETURN 9FT ADLT (ELECTROSURGICAL) ×3
ELECTRODE REM PT RTRN 9FT ADLT (ELECTROSURGICAL) ×2 IMPLANT
GAUZE SPONGE 4X4 12PLY STRL (GAUZE/BANDAGES/DRESSINGS) ×6 IMPLANT
GLOVE SURG ENC MOIS LTX SZ6.5 (GLOVE) ×9 IMPLANT
GLOVE SURG ENC MOIS LTX SZ7.5 (GLOVE) ×12 IMPLANT
GLOVE SURG UNDER POLY LF SZ6.5 (GLOVE) ×3 IMPLANT
GLOVE SURG UNDER POLY LF SZ7.5 (GLOVE) ×3 IMPLANT
GOWN STRL REUS W/ TWL LRG LVL3 (GOWN DISPOSABLE) ×10 IMPLANT
GOWN STRL REUS W/TWL LRG LVL3 (GOWN DISPOSABLE) ×5
GUIDEWIRE 3.2X400 (WIRE) ×3 IMPLANT
K-WIRE DBL TROCAR .045X4 (WIRE) ×6
K-WIRE DUAL TROCAR POINTS (0.045) (Pin) ×6 IMPLANT
KIT BASIN OR (CUSTOM PROCEDURE TRAY) ×3 IMPLANT
KIT TURNOVER KIT B (KITS) ×3 IMPLANT
KWIRE DBL TROCAR .045X4 (WIRE) ×4 IMPLANT
NAIL TFNA 10X390 (Nail) ×3 IMPLANT
PACK TOTAL JOINT (CUSTOM PROCEDURE TRAY) ×3 IMPLANT
PAD ARMBOARD 7.5X6 YLW CONV (MISCELLANEOUS) ×6 IMPLANT
PAD CAST 3X4 CTTN HI CHSV (CAST SUPPLIES) ×4 IMPLANT
PADDING CAST ABS 4INX4YD NS (CAST SUPPLIES) ×1
PADDING CAST ABS COTTON 4X4 ST (CAST SUPPLIES) ×2 IMPLANT
PADDING CAST COTTON 3X4 STRL (CAST SUPPLIES) ×2
PADDING CAST COTTON 6X4 STRL (CAST SUPPLIES) ×3 IMPLANT
REAMER ROD DEEP FLUTE 2.5X950 (INSTRUMENTS) ×6 IMPLANT
SCREW LOCK IM 46X5X TI NIOBIUM (Screw) ×2 IMPLANT
SCREW LOCK IM NAIL 5X46 (Screw) ×1 IMPLANT
SCREW LOCK IM NAIL 5X84 (Screw) ×3 IMPLANT
SCREW LOCK IM NL 5X38 (Screw) ×3 IMPLANT
SCREW LOCK IM TI 5X42 (Screw) ×3 IMPLANT
STAPLER VISISTAT 35W (STAPLE) ×3 IMPLANT
SUT CHROMIC 3 0 PS 2 (SUTURE) ×3 IMPLANT
SUT MNCRL AB 3-0 PS2 18 (SUTURE) IMPLANT
SUT VIC AB 0 CT1 27 (SUTURE)
SUT VIC AB 0 CT1 27XBRD ANBCTR (SUTURE) IMPLANT
SUT VIC AB 2-0 CT1 27 (SUTURE) ×2
SUT VIC AB 2-0 CT1 TAPERPNT 27 (SUTURE) ×4 IMPLANT
TOWEL GREEN STERILE (TOWEL DISPOSABLE) ×6 IMPLANT
TOWEL GREEN STERILE FF (TOWEL DISPOSABLE) ×3 IMPLANT
YANKAUER SUCT BULB TIP NO VENT (SUCTIONS) IMPLANT

## 2021-08-07 NOTE — Anesthesia Postprocedure Evaluation (Addendum)
Anesthesia Post Note  Patient: Tiburcio Bash Boccio  Procedure(s) Performed: INTRAMEDULLARY (IM) NAIL TIBIAL (Right) IRRIGATION AND DEBRIDEMENT PINNING RIGHT THUMB  (Right: Thumb)     Patient location during evaluation: PACU Anesthesia Type: General Level of consciousness: awake and alert and oriented Pain management: pain level controlled Vital Signs Assessment: post-procedure vital signs reviewed and stable Respiratory status: spontaneous breathing, nonlabored ventilation and respiratory function stable Cardiovascular status: blood pressure returned to baseline Postop Assessment: no apparent nausea or vomiting Anesthetic complications: no   No notable events documented.  Last Vitals:  Vitals:   08/07/21 1245 08/07/21 1303  BP: (!) 144/89 (!) 156/88  Pulse: (!) 50 (!) 103  Resp: 14 17  Temp: 36.5 C 37.2 C  SpO2: 99% 99%    Last Pain:  Vitals:   08/07/21 1303  TempSrc: Oral  PainSc:                  Shanda Howells
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
## 2021-08-07 NOTE — Transfer of Care (Signed)
Immediate Anesthesia Transfer of Care Note  Patient: Nathan Ball  Procedure(s) Performed: INTRAMEDULLARY (IM) NAIL TIBIAL (Right) IRRIGATION AND DEBRIDEMENT PINNING RIGHT THUMB  (Right: Thumb)  Patient Location: PACU  Anesthesia Type:General  Level of Consciousness: drowsy and patient cooperative  Airway & Oxygen Therapy: Patient Spontanous Breathing and Patient connected to face mask oxygen  Post-op Assessment: Report given to RN and Post -op Vital signs reviewed and stable  Post vital signs: Reviewed and stable  Last Vitals:  Vitals Value Taken Time  BP 135/55 08/07/21 1159  Temp    Pulse 66 08/07/21 1202  Resp 17 08/07/21 1202  SpO2 100 % 08/07/21 1202  Vitals shown include unvalidated device data.  Last Pain:  Vitals:   08/07/21 0847  TempSrc: Oral  PainSc:          Complications: No notable events documented.

## 2021-08-07 NOTE — Anesthesia Procedure Notes (Signed)
Procedure Name: Intubation Date/Time: 08/07/2021 9:35 AM Performed by: Inda Coke, CRNA Pre-anesthesia Checklist: Patient identified, Emergency Drugs available, Suction available and Patient being monitored Patient Re-evaluated:Patient Re-evaluated prior to induction Oxygen Delivery Method: Circle System Utilized Preoxygenation: Pre-oxygenation with 100% oxygen Induction Type: IV induction Ventilation: Mask ventilation without difficulty Laryngoscope Size: Mac and 4 Grade View: Grade II Tube type: Oral Tube size: 7.5 mm Number of attempts: 1 Airway Equipment and Method: Stylet and Oral airway Placement Confirmation: ETT inserted through vocal cords under direct vision, positive ETCO2 and breath sounds checked- equal and bilateral Secured at: 22 cm Tube secured with: Tape Dental Injury: Teeth and Oropharynx as per pre-operative assessment

## 2021-08-07 NOTE — Plan of Care (Signed)
  Problem: Education: Goal: Knowledge of General Education information will improve Description Including pain rating scale, medication(s)/side effects and non-pharmacologic comfort measures Outcome: Progressing   
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
## 2021-08-07 NOTE — Anesthesia Postprocedure Evaluation (Signed)
Anesthesia Post Note  Patient: Nathan Ball  Procedure(s) Performed: INTRAMEDULLARY (IM) NAIL TIBIAL (Right) IRRIGATION AND DEBRIDEMENT PINNING RIGHT THUMB  (Right: Thumb)     Patient location during evaluation: PACU Anesthesia Type: General Level of consciousness: awake and alert and oriented Pain management: pain level controlled Vital Signs Assessment: post-procedure vital signs reviewed and stable Respiratory status: spontaneous breathing, nonlabored ventilation and respiratory function stable Cardiovascular status: blood pressure returned to baseline Postop Assessment: no apparent nausea or vomiting Anesthetic complications: no   No notable events documented.  Last Vitals:  Vitals:   08/07/21 1245 08/07/21 1303  BP: (!) 144/89 (!) 156/88  Pulse: (!) 50 (!) 103  Resp: 14 17  Temp: 36.5 C 37.2 C  SpO2: 99% 99%    Last Pain:  Vitals:   08/07/21 1303  TempSrc: Oral  PainSc:                  Liz Jessye Imhoff     

## 2021-08-07 NOTE — Plan of Care (Signed)
  Problem: Education: Goal: Knowledge of General Education information will improve Description: Including pain rating scale, medication(s)/side effects and non-pharmacologic comfort measures 08/07/2021 1552 by Genevie Ann, RN Outcome: Progressing 08/07/2021 1550 by Genevie Ann, RN Outcome: Progressing
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
## 2021-08-08 LAB — CBC
HCT: 36.1 % — ABNORMAL LOW (ref 39.0–52.0)
Hemoglobin: 12.8 g/dL — ABNORMAL LOW (ref 13.0–17.0)
MCH: 35.1 pg — ABNORMAL HIGH (ref 26.0–34.0)
MCHC: 35.5 g/dL (ref 30.0–36.0)
MCV: 98.9 fL (ref 80.0–100.0)
Platelets: 244 10*3/uL (ref 150–400)
RBC: 3.65 MIL/uL — ABNORMAL LOW (ref 4.22–5.81)
RDW: 11.1 % — ABNORMAL LOW (ref 11.5–15.5)
WBC: 19.8 10*3/uL — ABNORMAL HIGH (ref 4.0–10.5)
nRBC: 0 % (ref 0.0–0.2)

## 2021-08-08 LAB — BASIC METABOLIC PANEL
Anion gap: 6 (ref 5–15)
BUN: 7 mg/dL (ref 6–20)
CO2: 27 mmol/L (ref 22–32)
Calcium: 8.6 mg/dL — ABNORMAL LOW (ref 8.9–10.3)
Chloride: 103 mmol/L (ref 98–111)
Creatinine, Ser: 0.86 mg/dL (ref 0.61–1.24)
GFR, Estimated: >60 mL/min (ref >60)
Glucose, Bld: 127 mg/dL — ABNORMAL HIGH (ref 70–99)
Potassium: 3.9 mmol/L (ref 3.5–5.1)
Sodium: 136 mmol/L (ref 135–145)

## 2021-08-08 MED ORDER — KETOROLAC TROMETHAMINE 15 MG/ML IJ SOLN
15.0000 mg | Freq: Four times a day (QID) | INTRAMUSCULAR | Status: AC
  Administered 2021-08-08 – 2021-08-09 (×5): 15 mg via INTRAVENOUS
  Filled 2021-08-08 (×5): qty 1

## 2021-08-08 MED ORDER — VITAMIN D (ERGOCALCIFEROL) 1.25 MG (50000 UNIT) PO CAPS
50000.0000 [IU] | ORAL_CAPSULE | ORAL | Status: DC
  Administered 2021-08-08: 50000 [IU] via ORAL
  Filled 2021-08-08 (×2): qty 1
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
## 2021-08-08 NOTE — Plan of Care (Signed)
  Problem: Education: Goal: Knowledge of General Education information will improve Description: Including pain rating scale, medication(s)/side effects and non-pharmacologic comfort measures Outcome: Progressing   Problem: Clinical Measurements: Goal: Will remain free from infection Outcome: Progressing   Problem: Activity: Goal: Risk for activity intolerance will decrease Outcome: Progressing   Problem: Coping: Goal: Level of anxiety will decrease Outcome: Progressing   Problem: Elimination: Goal: Will not experience complications related to bowel motility Outcome: Progressing   Problem: Pain Managment: Goal: General experience of comfort will improve Outcome: Progressing   
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
## 2021-08-09 ENCOUNTER — Encounter (HOSPITAL_COMMUNITY): Payer: Self-pay | Admitting: Student

## 2021-08-09 ENCOUNTER — Other Ambulatory Visit (HOSPITAL_COMMUNITY): Payer: Self-pay

## 2021-08-09 DIAGNOSIS — W3400XA Accidental discharge from unspecified firearms or gun, initial encounter: Secondary | ICD-10-CM

## 2021-08-09 DIAGNOSIS — S62511A Displaced fracture of proximal phalanx of right thumb, initial encounter for closed fracture: Secondary | ICD-10-CM

## 2021-08-09 LAB — BASIC METABOLIC PANEL
Anion gap: 6 (ref 5–15)
BUN: 6 mg/dL (ref 6–20)
CO2: 29 mmol/L (ref 22–32)
Calcium: 8.6 mg/dL — ABNORMAL LOW (ref 8.9–10.3)
Chloride: 104 mmol/L (ref 98–111)
Creatinine, Ser: 0.85 mg/dL (ref 0.61–1.24)
GFR, Estimated: >60 mL/min (ref >60)
Glucose, Bld: 112 mg/dL — ABNORMAL HIGH (ref 70–99)
Potassium: 3.9 mmol/L (ref 3.5–5.1)
Sodium: 139 mmol/L (ref 135–145)

## 2021-08-09 LAB — CBC
HCT: 31.8 % — ABNORMAL LOW (ref 39.0–52.0)
Hemoglobin: 10.7 g/dL — ABNORMAL LOW (ref 13.0–17.0)
MCH: 34 pg (ref 26.0–34.0)
MCHC: 33.6 g/dL (ref 30.0–36.0)
MCV: 101 fL — ABNORMAL HIGH (ref 80.0–100.0)
Platelets: 183 10*3/uL (ref 150–400)
RBC: 3.15 MIL/uL — ABNORMAL LOW (ref 4.22–5.81)
RDW: 11.2 % — ABNORMAL LOW (ref 11.5–15.5)
WBC: 12 10*3/uL — ABNORMAL HIGH (ref 4.0–10.5)
nRBC: 0 % (ref 0.0–0.2)

## 2021-08-09 MED ORDER — OXYCODONE-ACETAMINOPHEN 5-325 MG PO TABS
1.0000 | ORAL_TABLET | ORAL | 0 refills | Status: AC | PRN
  Filled 2021-08-09: qty 42, 7d supply, fill #0

## 2021-08-09 MED ORDER — VITAMIN D (ERGOCALCIFEROL) 1.25 MG (50000 UNIT) PO CAPS
50000.0000 [IU] | ORAL_CAPSULE | ORAL | 0 refills | Status: DC
  Filled 2021-08-09: qty 4, 28d supply, fill #0

## 2021-08-09 MED ORDER — ASPIRIN 325 MG PO TBEC
325.0000 mg | DELAYED_RELEASE_TABLET | Freq: Two times a day (BID) | ORAL | 0 refills | Status: AC
  Filled 2021-08-09: qty 60, 30d supply, fill #0
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
## 2021-08-09 NOTE — Progress Notes (Signed)
Mobility Specialist Progress Note    08/09/21 1529  Mobility  Activity Ambulated in hall  Level of Assistance Minimal assist, patient does 75% or more (chair follow)  Assistive Device  (PFRW)  Distance Ambulated (ft) 85 ft  Mobility Ambulated with assistance in hallway  Mobility Response Tolerated well  Mobility performed by Mobility specialist  Bed Position Chair  $Mobility charge 1 Mobility   Pt received in bed and agreeable. C/o 6/10 RLE pain and numb feeling on bottom of foot. Returned in chair and left with call bell in reach.   [place] Mobility Specialist  Mobility Specialist Phone: [phone]

## 2021-08-09 NOTE — Progress Notes (Signed)
Pt discharged to home. DC instructions given. No concerns voiced. Meds brought in from pharmacy to pt. Pt left unit in wheelchair pushed by nurse tech accompanied by mother and brother. Left in stable condition.
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
## 2021-08-09 NOTE — Discharge Instructions (Signed)
Orthopaedic Trauma Service Discharge Instructions   General Discharge Instructions  WEIGHT BEARING STATUS:  Right upper extremity: Non-weightbearing through right wrist and hand. Ok to [employer] through elbow.  Right lower extremity: Weightbearing as tolerated  RANGE OF MOTION/ACTIVITY: Right upper extremity: Don't remove splint. Ok for unrestricted elbow motion Right lower extremity: Ok for unrestricted knee and ankle motion., work on stretching ankle  Wound Care: Right upper extremity: Don't remove splint Right lower extremity: Incisions can be left open to air if there is no drainage. If incision continues to have drainage, follow wound care instructions below. Okay to shower if no drainage from incisions.  DVT/PE prophylaxis: Aspirin twice daily x 30 days  Diet: as you were eating previously.  Can use over the counter stool softeners and bowel preparations, such as Miralax, to help with bowel movements.  Narcotics can be constipating.  Be sure to drink plenty of fluids  PAIN MEDICATION USE AND EXPECTATIONS  You have likely been given narcotic medications to help control your pain.  After a traumatic event that results in an fracture (broken bone) with or without surgery, it is ok to use narcotic pain medications to help control one's pain.  We understand that everyone responds to pain differently and each individual patient will be evaluated on a regular basis for the continued need for narcotic medications. Ideally, narcotic medication use should last no more than 6-8 weeks (coinciding with fracture healing).   As a patient it is your responsibility as well to monitor narcotic medication use and report the amount and frequency you use these medications when you come to your office visit.   We would also advise that if you are using narcotic medications, you should take a dose prior to therapy to maximize you participation.  IF YOU ARE ON NARCOTIC MEDICATIONS IT IS NOT PERMISSIBLE  TO OPERATE A MOTOR VEHICLE (MOTORCYCLE/CAR/TRUCK/MOPED) OR HEAVY MACHINERY DO NOT MIX NARCOTICS WITH OTHER CNS (CENTRAL NERVOUS SYSTEM) DEPRESSANTS SUCH AS ALCOHOL   STOP SMOKING OR USING NICOTINE PRODUCTS!!!!  As discussed nicotine severely impairs your body's ability to heal surgical and traumatic wounds but also impairs bone healing.  Wounds and bone heal by forming microscopic blood vessels (angiogenesis) and nicotine is a vasoconstrictor (essentially, shrinks blood vessels).  Therefore, if vasoconstriction occurs to these microscopic blood vessels they essentially disappear and are unable to deliver necessary nutrients to the healing tissue.  This is one modifiable factor that you can do to dramatically increase your chances of healing your injury.    (This means no smoking, no nicotine gum, patches, etc)  DO NOT USE NONSTEROIDAL ANTI-INFLAMMATORY DRUGS (NSAID'S)  Using products such as Advil (ibuprofen), Aleve (naproxen), Motrin (ibuprofen) for additional pain control during fracture healing can delay and/or prevent the healing response.  If you would like to take over the counter (OTC) medication, Tylenol (acetaminophen) is ok.  However, some narcotic medications that are given for pain control contain acetaminophen as well. Therefore, you should not exceed more than 4000 mg of tylenol in a day if you do not have liver disease.  Also note that there are may OTC medicines, such as cold medicines and allergy medicines that my contain tylenol as well.  If you have any questions about medications and/or interactions please ask your doctor/PA or your pharmacist.      ICE AND ELEVATE INJURED/OPERATIVE EXTREMITY  Using ice and elevating the injured extremity above your heart can help with swelling and pain control.  Icing in a pulsatile  fashion, such as 20 minutes on and 20 minutes off, can be followed.    Do not place ice directly on skin. Make sure there is a barrier between to skin and the ice pack.     Using frozen items such as frozen peas works well as the conform nicely to the are that needs to be iced.  USE AN ACE WRAP OR TED HOSE FOR SWELLING CONTROL  In addition to icing and elevation, Ace wraps or TED hose are used to help limit and resolve swelling.  It is recommended to use Ace wraps or TED hose until you are informed to stop.    When using Ace Wraps start the wrapping distally (farthest away from the body) and wrap proximally (closer to the body)   Example: If you had surgery on your leg or thing and you do not have a splint on, start the ace wrap at the toes and work your way up to the thigh        If you had surgery on your upper extremity and do not have a splint on, start the ace wrap at your fingers and work your way up to the upper arm  IF YOU ARE IN A SPLINT OR CAST DO NOT REMOVE IT FOR ANY REASON   If your splint gets wet for any reason please contact the office immediately. You may shower in your splint or cast as long as you keep it dry.  This can be done by wrapping in a cast cover or garbage back (or similar)  Do Not stick any thing down your splint or cast such as pencils, money, or hangers to try and scratch yourself with.  If you feel itchy take benadryl as prescribed on the bottle for itching   CALL THE OFFICE WITH ANY QUESTIONS OR CONCERNS: [phone]   VISIT OUR WEBSITE FOR ADDITIONAL INFORMATION: [email or web address]    Discharge Wound Care Instructions  Do NOT apply any ointments, solutions or lotions to pin sites or surgical wounds.  These prevent needed drainage and even though solutions like hydrogen peroxide kill bacteria, they also damage cells lining the pin sites that help fight infection.  Applying lotions or ointments can keep the wounds moist and can cause them to breakdown and open up as well. This can increase the risk for infection. When in doubt call the office.  If any drainage is noted, use one layer of adaptic, then gauze, Kerlix, and an ace  wrap.  Once the incision is completely dry and without drainage, it may be left open to air out.  Showering may begin 36-48 hours later.  Cleaning gently with soap and water.

## 2021-08-09 NOTE — TOC CAGE-AID Note (Signed)
Transition of Care [facility]) - CAGE-AID Screening   Patient Details  Name: Khalfani Weideman MRN: [id number] Date of Birth: [birth year]  [facility]) CM/SW Contact:    Neita Landrigan C Tarpley-Carter, LCSWA Phone Number: 08/09/2021, 12:52 PM   Clinical Narrative: Pt participated in Cage-Aid.  Pt admitted to substance (cannabis) and ETOH (socially) use.  Pt was offered resources, pt refused.  CSW will provide pt with resources for possible future use.  Kevia Zaucha Tarpley-Carter, MSW, LCSW-A Pronouns:  She/Her/Hers [facility]itions [facility] Clinical Social Worker Direct Number:  [phone] [email or web address]    CAGE-AID Screening:    Have You Ever Felt You Ought to Cut Down on Your Drinking or Drug Use?: No Have People Annoyed You By [employer] Your Drinking Or Drug Use?: No Have You Felt Bad Or Guilty About Your Drinking Or Drug Use?: No Have You Ever Had a Drink or Used Drugs First Thing In The Morning to Steady Your Nerves or to Get Rid of a Hangover?: No CAGE-AID Score: 0  Substance Abuse Education Offered: Yes (Pt admitted to substance (cannabis) and ETOH (socially) use.)  Substance abuse interventions: Transport planner

## 2021-08-10 ENCOUNTER — Encounter (HOSPITAL_COMMUNITY): Payer: Self-pay | Admitting: Student
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
## 2023-12-23 ENCOUNTER — Emergency Department (HOSPITAL_COMMUNITY)
Admission: EM | Admit: 2023-12-23 | Discharge: 2023-12-24 | Disposition: A | Discharge status: Other Acute Inpatient Hospital | Payer: MEDICAID

## 2023-12-23 ENCOUNTER — Other Ambulatory Visit: Payer: Self-pay

## 2023-12-23 ENCOUNTER — Encounter (HOSPITAL_COMMUNITY): Payer: Self-pay | Admitting: Emergency Medicine

## 2023-12-23 DIAGNOSIS — F1110 Opioid abuse, uncomplicated: Secondary | ICD-10-CM | POA: Insufficient documentation

## 2023-12-23 DIAGNOSIS — F1114 Opioid abuse with opioid-induced mood disorder: Secondary | ICD-10-CM | POA: Insufficient documentation

## 2023-12-23 DIAGNOSIS — F1994 Other psychoactive substance use, unspecified with psychoactive substance-induced mood disorder: Secondary | ICD-10-CM | POA: Insufficient documentation

## 2023-12-23 DIAGNOSIS — R45851 Suicidal ideations: Secondary | ICD-10-CM | POA: Insufficient documentation

## 2023-12-23 HISTORY — DX: Unspecified firearm discharge, undetermined intent, initial encounter: Y24.9XXA

## 2023-12-23 HISTORY — DX: Accidental discharge from unspecified firearms or gun, initial encounter: W34.00XA

## 2023-12-23 LAB — CBC
HCT: 45.6 % (ref 39.0–52.0)
Hemoglobin: 15.4 g/dL (ref 13.0–17.0)
MCH: 33.3 pg (ref 26.0–34.0)
MCHC: 33.8 g/dL (ref 30.0–36.0)
MCV: 98.7 fL (ref 80.0–100.0)
Platelets: 224 10*3/uL (ref 150–400)
RBC: 4.62 MIL/uL (ref 4.22–5.81)
RDW: 11.7 % (ref 11.5–15.5)
WBC: 9.1 10*3/uL (ref 4.0–10.5)
nRBC: 0 % (ref 0.0–0.2)

## 2023-12-23 LAB — COMPREHENSIVE METABOLIC PANEL
ALT: 21 U/L (ref 0–44)
AST: 23 U/L (ref 15–41)
Albumin: 3.1 g/dL — ABNORMAL LOW (ref 3.5–5.0)
Alkaline Phosphatase: 47 U/L (ref 38–126)
Anion gap: 9 (ref 5–15)
BUN: 6 mg/dL (ref 6–20)
CO2: 23 mmol/L (ref 22–32)
Calcium: 8.9 mg/dL (ref 8.9–10.3)
Chloride: 108 mmol/L (ref 98–111)
Creatinine, Ser: 0.98 mg/dL (ref 0.61–1.24)
GFR, Estimated: >60 mL/min (ref >60)
Glucose, Bld: 99 mg/dL (ref 70–99)
Potassium: 4.1 mmol/L (ref 3.5–5.1)
Sodium: 140 mmol/L (ref 135–145)
Total Bilirubin: 2.2 mg/dL — ABNORMAL HIGH (ref 0.0–1.2)
Total Protein: 5.7 g/dL — ABNORMAL LOW (ref 6.5–8.1)

## 2023-12-23 LAB — ACETAMINOPHEN LEVEL: Acetaminophen (Tylenol), Serum: <10 ug/mL — ABNORMAL LOW (ref 10–30)

## 2023-12-23 LAB — ETHANOL: Alcohol, Ethyl (B): <10 mg/dL (ref <10)

## 2023-12-23 LAB — SALICYLATE LEVEL: Salicylate Lvl: <7 mg/dL — ABNORMAL LOW (ref 7.0–30.0)
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
## 2023-12-24 ENCOUNTER — Other Ambulatory Visit (HOSPITAL_COMMUNITY)
Admission: EM | Admit: 2023-12-24 | Discharge: 2023-12-28 | Disposition: A | Discharge status: Home / Self Care | Payer: MEDICAID | Attending: Psychiatry | Admitting: Nurse Practitioner

## 2023-12-24 DIAGNOSIS — F1590 Other stimulant use, unspecified, uncomplicated: Secondary | ICD-10-CM

## 2023-12-24 DIAGNOSIS — Z5900 Homelessness unspecified: Secondary | ICD-10-CM

## 2023-12-24 DIAGNOSIS — F1110 Opioid abuse, uncomplicated: Secondary | ICD-10-CM | POA: Insufficient documentation

## 2023-12-24 DIAGNOSIS — F1994 Other psychoactive substance use, unspecified with psychoactive substance-induced mood disorder: Secondary | ICD-10-CM

## 2023-12-24 DIAGNOSIS — F1190 Opioid use, unspecified, uncomplicated: Secondary | ICD-10-CM

## 2023-12-24 DIAGNOSIS — F1210 Cannabis abuse, uncomplicated: Secondary | ICD-10-CM

## 2023-12-24 DIAGNOSIS — R45851 Suicidal ideations: Secondary | ICD-10-CM | POA: Insufficient documentation

## 2023-12-24 DIAGNOSIS — F1410 Cocaine abuse, uncomplicated: Secondary | ICD-10-CM

## 2023-12-24 DIAGNOSIS — F17210 Nicotine dependence, cigarettes, uncomplicated: Secondary | ICD-10-CM | POA: Insufficient documentation

## 2023-12-24 DIAGNOSIS — F1990 Other psychoactive substance use, unspecified, uncomplicated: Secondary | ICD-10-CM

## 2023-12-24 DIAGNOSIS — F17200 Nicotine dependence, unspecified, uncomplicated: Secondary | ICD-10-CM

## 2023-12-24 DIAGNOSIS — F1510 Other stimulant abuse, uncomplicated: Secondary | ICD-10-CM

## 2023-12-24 LAB — RAPID URINE DRUG SCREEN, HOSP PERFORMED
Amphetamines: POSITIVE — AB
Barbiturates: NOT DETECTED
Benzodiazepines: NOT DETECTED
Cocaine: POSITIVE — AB
Opiates: POSITIVE — AB
Tetrahydrocannabinol: POSITIVE — AB

## 2023-12-24 MED ORDER — ACETAMINOPHEN 325 MG PO TABS: 650.0000 mg | ORAL_TABLET | Freq: Four times a day (QID) | ORAL | Status: DC | PRN

## 2023-12-24 MED ORDER — DICYCLOMINE HCL 20 MG PO TABS: 20.0000 mg | ORAL_TABLET | Freq: Four times a day (QID) | ORAL | Status: DC | PRN

## 2023-12-24 MED ORDER — HYDROXYZINE HCL 25 MG PO TABS
25.0000 mg | ORAL_TABLET | Freq: Three times a day (TID) | ORAL | Status: DC | PRN
  Administered 2023-12-25 – 2023-12-26 (×2): 25 mg via ORAL
  Filled 2023-12-24 (×2): qty 1

## 2023-12-24 MED ORDER — DIPHENHYDRAMINE HCL 50 MG/ML IJ SOLN: 50.0000 mg | Freq: Three times a day (TID) | INTRAMUSCULAR | Status: DC | PRN

## 2023-12-24 MED ORDER — MAGNESIUM HYDROXIDE 400 MG/5ML PO SUSP: 30.0000 mL | Freq: Every day | ORAL | Status: DC | PRN

## 2023-12-24 MED ORDER — NAPROXEN 500 MG PO TABS: 500.0000 mg | ORAL_TABLET | Freq: Two times a day (BID) | ORAL | Status: DC | PRN

## 2023-12-24 MED ORDER — HALOPERIDOL LACTATE 5 MG/ML IJ SOLN: 5.0000 mg | Freq: Three times a day (TID) | INTRAMUSCULAR | Status: DC | PRN

## 2023-12-24 MED ORDER — HALOPERIDOL 5 MG PO TABS: 5.0000 mg | ORAL_TABLET | Freq: Three times a day (TID) | ORAL | Status: DC | PRN

## 2023-12-24 MED ORDER — LORAZEPAM 2 MG/ML IJ SOLN: 2.0000 mg | Freq: Three times a day (TID) | INTRAMUSCULAR | Status: DC | PRN

## 2023-12-24 MED ORDER — LOPERAMIDE HCL 2 MG PO CAPS: 2.0000 mg | ORAL_CAPSULE | ORAL | Status: DC | PRN

## 2023-12-24 MED ORDER — HALOPERIDOL LACTATE 5 MG/ML IJ SOLN: 10.0000 mg | Freq: Three times a day (TID) | INTRAMUSCULAR | Status: DC | PRN

## 2023-12-24 MED ORDER — METHOCARBAMOL 500 MG PO TABS: 500.0000 mg | ORAL_TABLET | Freq: Three times a day (TID) | ORAL | Status: DC | PRN

## 2023-12-24 MED ORDER — ONDANSETRON 4 MG PO TBDP: 4.0000 mg | ORAL_TABLET | Freq: Four times a day (QID) | ORAL | Status: DC | PRN

## 2023-12-24 MED ORDER — DIPHENHYDRAMINE HCL 50 MG PO CAPS: 50.0000 mg | ORAL_CAPSULE | Freq: Three times a day (TID) | ORAL | Status: DC | PRN

## 2023-12-24 MED ORDER — ALUM & MAG HYDROXIDE-SIMETH 200-200-20 MG/5ML PO SUSP: 30.0000 mL | ORAL | Status: DC | PRN

## 2023-12-24 NOTE — BH Assessment (Signed)
@   12:52 AM-Patient was deferred to [facility] Telehealth Care Coordinator (TCC), Jonny Ruiz, at [phone]. The [facility] Care Coordinator will notify the patient's care team when the [facility] provider is ready to initiate the telehealth assessment. If there are any questions, please reach out to the [facility] Coordinator.
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
## 2023-12-24 NOTE — ED Provider Notes (Signed)
 [place] EMERGENCY DEPARTMENT AT [facility] Provider Note   CSN: [id number] Arrival date & time: 12/23/23  2145     History  Chief Complaint  Patient presents with   Psychiatric Evaluation   Hallucinations   Suicidal    Nathan Ball is a 33 y.o. male.  The history is provided by the patient and medical records.   33 year old male presenting to the ED for mental health evaluation.  Patient reports he has been very depressed over the past few months.  States he had a break-up with significant other, family (aside from mother) does not want anything to do with him.  Because of this he feels like he is lost a lot of his support system.  States he has been abusing fentanyl most recently, has been using IV and attempting to overdose but "it never works".  Does not occasionally use cocaine and meth as well.  He denies alcohol use.  He does admit that he does not want to live anymore.  He denies any HI/AVH.  Not currently on any psychiatric medications.  He does not currently have a psychiatrist or counselor.  Home Medications Prior to Admission medications   Medication Sig Start Date End Date Taking? Authorizing Provider  HYDROcodone-acetaminophen (NORCO/VICODIN) 5-325 MG tablet Take 1 tablet by mouth every 6 (six) hours as needed for severe pain. 03/06/20   West Bali, PA-C  Vitamin D, Ergocalciferol, (DRISDOL) 1.25 MG (50000 UNIT) CAPS capsule Take 1 capsule (50,000 Units total) by mouth every 7 (seven) days. 08/15/21   West Bali, PA-C      Allergies    Patient has no known allergies.    Review of Systems   Review of Systems  Psychiatric/Behavioral:  Positive for suicidal ideas.   All other systems reviewed and are negative.   Physical Exam Updated Vital Signs BP 126/77 (BP Location: Right Arm)   Pulse 62   Temp 98.2 F (36.8 C)   Resp 16   Ht 6' (1.829 m)   Wt 90.7 kg   SpO2 100%   BMI 27.12 kg/m  Physical Exam Vitals and nursing note  reviewed.  Constitutional:      Appearance: He is well-developed.  HENT:     Head: Normocephalic and atraumatic.  Eyes:     Conjunctiva/sclera: Conjunctivae normal.     Pupils: Pupils are equal, round, and reactive to light.  Cardiovascular:     Rate and Rhythm: Normal rate and regular rhythm.     Heart sounds: Normal heart sounds.  Pulmonary:     Effort: Pulmonary effort is normal.     Breath sounds: Normal breath sounds.  Abdominal:     General: Bowel sounds are normal.     Palpations: Abdomen is soft.  Musculoskeletal:        General: Normal range of motion.     Cervical back: Normal range of motion.  Skin:    General: Skin is warm and dry.  Neurological:     Mental Status: He is alert and oriented to person, place, and time.  Psychiatric:        Thought Content: Thought content includes suicidal ideation. Thought content does not include homicidal ideation. Thought content includes suicidal plan. Thought content does not include homicidal plan.     Comments: Seems depressed/sad, makes eye contact, seems to have insight; SI with plan to OD, denies HI/AVH     ED Results / Procedures / Treatments   Labs (all labs ordered  are listed, but only abnormal results are displayed) Labs Reviewed  COMPREHENSIVE METABOLIC PANEL - Abnormal; Notable for the following components:      Result Value   Total Protein 5.7 (*)    Albumin 3.1 (*)    Total Bilirubin 2.2 (*)    All other components within normal limits  SALICYLATE LEVEL - Abnormal; Notable for the following components:   Salicylate Lvl <7.0 (*)    All other components within normal limits  ACETAMINOPHEN LEVEL - Abnormal; Notable for the following components:   Acetaminophen (Tylenol), Serum <10 (*)    All other components within normal limits  RAPID URINE DRUG SCREEN, HOSP PERFORMED - Abnormal; Notable for the following components:   Opiates POSITIVE (*)    Cocaine POSITIVE (*)    Amphetamines POSITIVE (*)     Tetrahydrocannabinol POSITIVE (*)    All other components within normal limits  ETHANOL  CBC    EKG None  Radiology No results found.  Procedures Procedures    Medications Ordered in ED Medications - No data to display  ED Course/ Medical Decision Making/ A&P                                 Medical Decision Making Amount and/or Complexity of Data Reviewed Labs: ordered. ECG/medicine tests: ordered and independent interpretation performed.   33 y.o. M here with SI.  Depressed over the past few months due to multiple reasons--has lost contact with family, broke up with significant other, etc.  Admits to attempting to OD on drugs over the past few days to kill himself.  Denies any HI/AVH.  He has no physical complaints here.  Labs as above--no leukocytosis, no electrolyte derangement.  Negative Tylenol, salicylate, and ethanol levels.  UDS is positive for opiates, cocaine, amphetamines, THC.  Medically cleared.  Will get TTS evaluation.  TTS not done overnight, day team to follow-up on recommendations.  Final Clinical Impression(s) / ED Diagnoses Final diagnoses:  Suicidal ideation    Rx / DC Orders ED Discharge Orders     None         Garlon Hatchet, PA-C 12/24/23 1610    Durwin Glaze, MD 12/24/23 [phone]

## 2023-12-24 NOTE — ED Provider Notes (Signed)
 Emergency Medicine Observation Re-evaluation Note  Nathan Ball is a 33 y.o. male, seen on rounds today.  Pt initially presented to the ED for complaints of Psychiatric Evaluation, Hallucinations, and Suicidal Currently, the patient does not have any acute complaints  Physical Exam  BP 116/60 (BP Location: Right Arm)   Pulse 76   Temp 98.1 F (36.7 C) (Oral)   Resp 18   Ht 6' (1.829 m)   Wt 90.7 kg   SpO2 99%   BMI 27.12 kg/m  Physical Exam General: Resting comfortably in stretcher Lungs: Normal work of breathing Psych: Calm   ED Course / MDM  EKG:   I have reviewed the labs performed to date as well as medications administered while in observation.  Recent changes in the last 24 hours include patient seen by psychiatry and they are recommending FBC.  Plan  Current plan is for transfer to [facility] for detox.    Rondel Baton, MD 12/24/23 1452

## 2023-12-24 NOTE — ED Notes (Signed)
 Pt requires sitter and sitter is not present

## 2023-12-24 NOTE — Progress Notes (Signed)
 [facility] 12/24/2023 Bed assignment: Pending   Pt meets inpatient criteria per: [name] NP  Attending Physician will be: N/A   Care Team Notified: [name] NP, [facility] RN  [place] Ingwersen LCSW-A   12/24/2023 1:04 PM

## 2023-12-24 NOTE — ED Notes (Signed)
 Assumed pt care from [facility]

## 2023-12-24 NOTE — ED Notes (Signed)
 Pt wanded by security, changed into purple scrubs with non-slip socks and all belongings inventoried. One bag of belongings placed in locker #8. One valuables envelope placed with security.

## 2023-12-25 DIAGNOSIS — F1410 Cocaine abuse, uncomplicated: Secondary | ICD-10-CM | POA: Diagnosis not present

## 2023-12-25 DIAGNOSIS — F1994 Other psychoactive substance use, unspecified with psychoactive substance-induced mood disorder: Secondary | ICD-10-CM | POA: Diagnosis not present

## 2023-12-25 DIAGNOSIS — F1210 Cannabis abuse, uncomplicated: Secondary | ICD-10-CM | POA: Diagnosis not present

## 2023-12-25 DIAGNOSIS — F1110 Opioid abuse, uncomplicated: Secondary | ICD-10-CM | POA: Diagnosis not present

## 2023-12-25 LAB — HEPATITIS PANEL, ACUTE
HCV Ab: NONREACTIVE
Hep A IgM: NONREACTIVE
Hep B C IgM: NONREACTIVE
Hepatitis B Surface Ag: NONREACTIVE

## 2023-12-25 LAB — HIV ANTIBODY (ROUTINE TESTING W REFLEX): HIV Screen 4th Generation wRfx: NONREACTIVE

## 2023-12-25 MED ORDER — QUETIAPINE FUMARATE 100 MG PO TABS
100.0000 mg | ORAL_TABLET | Freq: Every day | ORAL | Status: DC
  Administered 2023-12-25 – 2023-12-27 (×3): 100 mg via ORAL
  Filled 2023-12-25 (×3): qty 1
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
## 2023-12-25 NOTE — ED Notes (Signed)
 Patient is sleeping. Respirations equal and unlabored, skin warm and dry. No change in assessment or acuity. Routine safety checks conducted according to facility protocol. Will continue to monitor for safety.

## 2023-12-25 NOTE — Tx Team (Signed)
 LCSW and Resident met with patient at bedside to assess current mood, affect, physical state, and inquire about needs/goals while here in [facility] and after discharge. Patient reports he presented due to fentanyl use and attempt to kill himself. Patient reports experiencing PTSD and reports getting into a recent argument with his brothers that resulted in him being homeless for the last 2 months. Patient reports he is from [place], [place], however decided to come to [place] to live with brothers, however reports no one picked him up like they said they would. Patient he has felt suicidal for the last two months and reports a history of suicide attempts by overdose one fentanyl, however reports "it never works". Patient reports he attempted to overdose back in November 2024 and January 2025 and reports being unsuccessful. Patient denies any prior inpatient psychiatric admissions. Patient reports fentanyl is not his drug of choice. 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 Patient reports he occasionally uses cocaine and reports average use 3 times a week. Patient reports he has been smoking marijuana since the age of 27. Patient reports one prior hospitalization in November 2024 at [facility] and a detox in [place], [place]. Patient reports he would like to go to a residential program once stable for discharge. Patient reports he has an upcoming court date on January 06, 2024 for possession. Patient was informed that his court date will be an interference with placement as most agencies require that legal issues are addressed prior to being considered. Patient expressed understanding and reported he can probably go home with his mother in [place], [place] until he handles his court situation. Patient provided permission for LCSW to follow up with his mother Cordelia Pen [phone] to gain collateral. Patient reports  he does not believe his mother would have a problem with him returning home with her. Patient aware LCSW will follow up and provide updates as received.   LCSW attempted to get in contact with patient's mother at number listed above, however received no answer. LCSW left voice message at 2:21pm requesting phone call back. LCSW will follow up at a late time.   Fernande Boyden, LCSW Clinical Social Worker [facility] Ph: [phone]

## 2023-12-25 NOTE — ED Notes (Signed)
 Pt resting quietly in bed at the current with eyes closed. Breathing is even and unlabored, no distress/discomfort noted or voiced. Pt denies SI, HI, pain and AVH. Will continue to monitor for safety and report any COC.

## 2023-12-25 NOTE — ED Notes (Signed)
 All patients going outside for group therapy: this pt refused to go, wishing to stay inside and sleep instead.

## 2023-12-25 NOTE — ED Notes (Signed)
 Pt interacting with other pts in the group room. Dinner was given earlier. No s/sx of distress. No concerns at this time.

## 2023-12-25 NOTE — Discharge Planning (Signed)
 LCSW received phone call back from the patient's mother Novella Rob [phone]. LCSW explored if mother would have concerns with the patient returning home with her once stable for discharge.  Mother reports that is not an option at this time as she believes patient needs inpatient residential treatment for substance use.  Mother was informed that patient would currently face a barrier with residential placement, as most facilities require legal obligations to be resolved prior to admission.  Mother reports she has spoken with the patient's attorney who reports he would like for the patient to seek further treatment for himself and he will handle court situations for the patient.  Mother reports she will be sure to contact the patient's attorney to inform about current admission and status. Mother reports the patient has support from 3 other brothers that stay here in [place].  Mother reports the family's plan is to have an intervention with the patient in order to help him get back on track.  Mother reports she will follow up with one of the brothers to see if he would be willing to accept the patient if residential placement is not an option.  Mother reports she will follow up and provide updates once received.  No other issues were reported by mother at this time.  LCSW will continue to follow and provide support to patient and family while on [facility] unit.   Fernande Boyden, LCSW Clinical Social Worker [place] [facility] Ph: [phone]

## 2023-12-25 NOTE — ED Notes (Signed)
 Pt in dayroom with peers, playing cards. Flat affect but pleasant. Denies SI/ HI/ AVH. Denies withdrawal symptoms. No noted distress.Will continue to monitor for safety

## 2023-12-25 NOTE — ED Notes (Addendum)
 Pt is currently resting in room, resp are even and unlabored. There are no apparent s/sx of distress. No further concerns at this time.

## 2023-12-25 NOTE — Discharge Instructions (Signed)
 [facility] [address], [place], [address] [phone] phone  New Patient Assessment/Therapy Walk-Ins:  Monday and Wednesday: 8 am until slots are full. Every 1st and 2nd Fridays of the month: 1 pm - 5 pm.  NO ASSESSMENT/THERAPY WALK-INS ON TUESDAYS OR THURSDAYS  New Patient Assessment/Medication Management Walk-Ins:  Monday - Friday:  8 am - 11 am.  For all walk-ins, we ask that you arrive by 7:30 am because patients will be seen in the order of arrival.  Availability is limited; therefore, you may not be seen on the same day that you walk-in.  Our goal is to serve and meet the needs of our community to the best of our [place] ability.  SUBSTANCE USE TREATMENT for Medicaid and State Funded/IPRS  [facility] [address], [place], [address] [phone] phone NOTE: [facility] is no longer offering IOP services.  Serves those who are low-income or have no insurance.  [facility] [address] [place], [place], [address] [phone] phone [phone] fax NOTE: Does have Substance Abuse-Intensive Outpatient Program [facility]) as well as transitional housing if eligible.  [facility] [address] [place], [place], [address] [phone] phone [phone] fax  [facility] [phone] [place], [place], [address] [phone] phone [phone] fax  HALFWAY HOUSES:  [facility] [phone]  [employer].oxfordvacancies.com  12 STEP PROGRAMS:  Alcoholics Anonymous of [place] SoftwareChalet.be  Narcotics Anonymous of [place] [employer]ct.dk  Al-Anon of [place], [place] www.[place]alanon.org/find-meetings.html  Nar-Anon https://nar-anon.org/find-a-meetin  List of Residential placements:   [facility] in [place]: [phone]  [facility]: [phone]  [place], [place]  [phone]: Male and male facility; 30-day program: (uninsured and Medicaid such as Laurena Bering, [place], [place], partners)  [facility]: [phone]; men and women's facility; 28 days; Can have Medicaid tailored plan Tour manager or Partners)  [facility]: [phone] Karoline Caldwell or Larita Fife; 28 day program; must be fully detox; tailored Medicaid or no insurance  [address] in [place], [place]; [phone]; 28 day all males program; no insurance accepted  [facility] Referral in [place]: Gabriel Rung [phone] (no insurance or Medicaid only); 90 days; outpatient services but provide housing in apartments downtown [place]  [facility] Admission: [phone]: Patient must complete phone screening for placement: [place], [place]; 6 month program; uninsured, Medicaid, and [employer].   [facility]: no insurance required; [phone]  [facility]: [phone]; Intake: Molly Maduro; Must fill out application online; Alecia Lemming Delay [phone] x [address] [facility] in [place], [place]: [phone]; Admissions Coordinators Mr. Maurine Minister or Barron Alvine; 90 day program.  [facility]: [place], [place] [phone]; Co-Ed 9 month to a year program; Online application; Men entry fee is $500 (6-53months);  [employer]: [address] [place], [place] [address]; no fee or insurance required; minimum of 2 years; Highly structured; work based; Intake Coordinator is Thayer Ohm [phone]  [facility] in [place], [place]: [phone]; Fax number is [phone]; website: www.[facility].org; Requires 3-6 page autobiography; 2 year program (18 months and then 25month transitional housing); Admission fee is $300; no insurance needed; work Automotive engineer in [place], [place]: [employer] Desk Staff: Danise Edge [phone]: They have a Men's Regenerations Program 6-39months. Free program; There is an initial $300 fee however, they are willing to work  with patients regarding that. Application is online.  [facility]: Admissions [phone] Doran Heater ext [phone]; Any 7-90 day program is out of pocket; 12  month program is free of charge; there is a $275 entry fee; Patient is responsible for own transportation

## 2023-12-26 DIAGNOSIS — F1994 Other psychoactive substance use, unspecified with psychoactive substance-induced mood disorder: Secondary | ICD-10-CM | POA: Diagnosis not present

## 2023-12-26 DIAGNOSIS — F1110 Opioid abuse, uncomplicated: Secondary | ICD-10-CM | POA: Diagnosis not present

## 2023-12-26 DIAGNOSIS — F1210 Cannabis abuse, uncomplicated: Secondary | ICD-10-CM | POA: Diagnosis not present

## 2023-12-26 DIAGNOSIS — F1410 Cocaine abuse, uncomplicated: Secondary | ICD-10-CM | POA: Diagnosis not present

## 2023-12-26 LAB — RPR: RPR Ser Ql: NONREACTIVE

## 2023-12-26 NOTE — ED Notes (Signed)
Pt observed/assessed in room sleeping. RR even and unlabored, appearing in no noted distress. Environmental check complete, will continue to monitor for safety 
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
## 2023-12-26 NOTE — ED Notes (Signed)
 Patient was provided breakfast

## 2023-12-26 NOTE — ED Notes (Signed)
 Pt resting in bed at the current c eyes closed. No s/s of acute distress, pain or any discomfort at this time. VSS. Safety maintained. Denies SI, HI, AVH, & pain. Will continue to monitor and report any COC.

## 2023-12-26 NOTE — ED Notes (Signed)
 Patient in the dayroom pleasant , watching TV with other patients. NAD.  Respirations are even and unlabored.  Will continue to monitor for safety
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
## 2023-12-27 ENCOUNTER — Encounter (HOSPITAL_COMMUNITY): Payer: Self-pay | Admitting: Nurse Practitioner

## 2023-12-27 DIAGNOSIS — F1110 Opioid abuse, uncomplicated: Secondary | ICD-10-CM | POA: Diagnosis not present

## 2023-12-27 DIAGNOSIS — F1994 Other psychoactive substance use, unspecified with psychoactive substance-induced mood disorder: Secondary | ICD-10-CM | POA: Diagnosis not present

## 2023-12-27 DIAGNOSIS — F1210 Cannabis abuse, uncomplicated: Secondary | ICD-10-CM | POA: Diagnosis not present

## 2023-12-27 DIAGNOSIS — F1410 Cocaine abuse, uncomplicated: Secondary | ICD-10-CM | POA: Diagnosis not present

## 2023-12-27 LAB — LIPID PANEL
Cholesterol: 145 mg/dL (ref 0–200)
HDL: 49 mg/dL (ref >40)
LDL Cholesterol: 85 mg/dL (ref 0–99)
Total CHOL/HDL Ratio: 3 ratio
Triglycerides: 57 mg/dL (ref <150)
VLDL: 11 mg/dL (ref 0–40)

## 2023-12-27 LAB — HEMOGLOBIN A1C
Hgb A1c MFr Bld: 4 % — ABNORMAL LOW (ref 4.8–5.6)
Mean Plasma Glucose: 68.1 mg/dL

## 2023-12-27 LAB — TSH: TSH: 0.814 u[IU]/mL (ref 0.350–4.500)
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
## 2023-12-27 NOTE — ED Notes (Signed)
 Pt is in the bedroom sleeping comfortably and stable as well. Will continue to monitor for safety.

## 2023-12-27 NOTE — ED Notes (Signed)
 Patient is sleeping. Respirations equal and unlabored, skin warm and dry. No change in assessment or acuity. Routine safety checks conducted according to facility protocol. Will continue to monitor for safety.

## 2023-12-27 NOTE — Discharge Planning (Signed)
 LCSW went and spoke with patient and advised him to follow up with [facility] to complete a phone screening. Number provided and patient to follow up on today. Updates will be provided once received. No concerns to report.   Fernande Boyden, LCSW Clinical Social Worker [facility] Ph: [phone]

## 2023-12-27 NOTE — ED Notes (Signed)
 Patient resting with eyes closed in no apparent acute distress. Respirations even and unlabored. Environment secured. Safety checks in place according to facility policy.

## 2023-12-27 NOTE — ED Notes (Signed)
 Pt is in the dayroom watching TV with peers. Pt denies SI/HI/AVH. Pt has no further complain.No acute distress noted. Will continue to monitor for safety and provide support.

## 2023-12-28 DIAGNOSIS — F1410 Cocaine abuse, uncomplicated: Secondary | ICD-10-CM | POA: Diagnosis not present

## 2023-12-28 DIAGNOSIS — F1210 Cannabis abuse, uncomplicated: Secondary | ICD-10-CM | POA: Diagnosis not present

## 2023-12-28 DIAGNOSIS — F1994 Other psychoactive substance use, unspecified with psychoactive substance-induced mood disorder: Secondary | ICD-10-CM | POA: Diagnosis not present

## 2023-12-28 DIAGNOSIS — F1110 Opioid abuse, uncomplicated: Secondary | ICD-10-CM | POA: Diagnosis not present

## 2023-12-28 MED ORDER — QUETIAPINE FUMARATE 100 MG PO TABS: 100.0000 mg | ORAL_TABLET | Freq: Every day | ORAL | 0 refills | Status: AC

## 2023-12-28 MED ORDER — HYDROXYZINE HCL 25 MG PO TABS: 25.0000 mg | ORAL_TABLET | Freq: Three times a day (TID) | ORAL | 0 refills | Status: AC | PRN

## 2023-12-28 NOTE — Discharge Summary (Signed)
 Nathan Ball to be D/C'd Home per MD order. Discussed with the patient and all questions fully answered. An After Visit Summary was printed and given to the patient. Medication scripts were also given to patient. All belongings returned. Patient escorted out and D/C home via private auto.  Dickie La  12/28/2023 5:03 PM

## 2023-12-28 NOTE — Progress Notes (Signed)
 Pt is presently asleep. Respirations are even and unlabored. No signs of acute distress noted. Pt denies pain and current SI/HI/AVH, plan or intent. Staff will monitor for pt's safety.
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
## 2023-12-28 NOTE — ED Notes (Signed)
 Patient is sleeping. Respirations equal and unlabored, skin warm and dry. No change in assessment or acuity. Routine safety checks conducted according to facility protocol. Will continue to monitor for safety.

## 2023-12-30 LAB — GC/CHLAMYDIA PROBE AMP (~~LOC~~) NOT AT ARMC
Chlamydia: NEGATIVE
Comment: NEGATIVE
Comment: NORMAL
Neisseria Gonorrhea: NEGATIVE
